# Patient Record
Sex: Female | Born: 1990 | Race: White | Hispanic: No | Marital: Married | Smoking: Never smoker
Health system: Southern US, Community
[De-identification: ages and names within clinical notes are randomized; demographics above are authoritative.]

## PROBLEM LIST (undated history)

## (undated) DIAGNOSIS — F419 Anxiety disorder, unspecified: Secondary | ICD-10-CM

## (undated) DIAGNOSIS — R519 Headache, unspecified: Secondary | ICD-10-CM

## (undated) DIAGNOSIS — O24419 Gestational diabetes mellitus in pregnancy, unspecified control: Secondary | ICD-10-CM

## (undated) DIAGNOSIS — D219 Benign neoplasm of connective and other soft tissue, unspecified: Secondary | ICD-10-CM

## (undated) HISTORY — PX: TONSILLECTOMY AND ADENOIDECTOMY: SUR1326

---
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
## 2013-11-21 ENCOUNTER — Encounter: Payer: Self-pay | Admitting: Advanced Practice Midwife

## 2013-11-21 ENCOUNTER — Ambulatory Visit (INDEPENDENT_AMBULATORY_CARE_PROVIDER_SITE_OTHER): Payer: Managed Care, Other (non HMO) | Admitting: Advanced Practice Midwife

## 2013-11-21 ENCOUNTER — Encounter: Payer: Self-pay | Admitting: *Deleted

## 2013-11-21 VITALS — BP 132/79 | HR 88 | Ht 67.0 in | Wt 165.0 lb

## 2013-11-21 DIAGNOSIS — Z3480 Encounter for supervision of other normal pregnancy, unspecified trimester: Secondary | ICD-10-CM

## 2013-11-21 DIAGNOSIS — Z113 Encounter for screening for infections with a predominantly sexual mode of transmission: Secondary | ICD-10-CM

## 2013-11-21 DIAGNOSIS — O26859 Spotting complicating pregnancy, unspecified trimester: Secondary | ICD-10-CM

## 2013-11-21 DIAGNOSIS — Z3491 Encounter for supervision of normal pregnancy, unspecified, first trimester: Secondary | ICD-10-CM | POA: Insufficient documentation

## 2013-11-21 DIAGNOSIS — Z3490 Encounter for supervision of normal pregnancy, unspecified, unspecified trimester: Secondary | ICD-10-CM

## 2013-11-21 DIAGNOSIS — O26851 Spotting complicating pregnancy, first trimester: Secondary | ICD-10-CM

## 2013-11-21 LAB — OB RESULTS CONSOLE GC/CHLAMYDIA
Chlamydia: NEGATIVE
Gonorrhea: NEGATIVE

## 2013-11-21 NOTE — Patient Instructions (Signed)
Pregnancy - First Trimester During sexual intercourse, millions of sperm go into the vagina. Only 1 sperm will penetrate and fertilize the female egg while it is in the Fallopian tube. One week later, the fertilized egg implants into the wall of the uterus. An embryo begins to develop into a baby. At 6 to 8 weeks, the eyes and face are formed and the heartbeat can be seen on ultrasound. At the end of 12 weeks (first trimester), all the baby's organs are formed. Now that you are pregnant, you will want to do everything you can to have a healthy baby. Two of the most important things are to get good prenatal care and follow your caregiver's instructions. Prenatal care is all the medical care you receive before the baby's birth. It is given to prevent, find, and treat problems during the pregnancy and childbirth. PRENATAL EXAMS  During prenatal visits, your weight, blood pressure, and urine are checked. This is done to make sure you are healthy and progressing normally during the pregnancy.  A pregnant woman should gain 25 to 35 pounds during the pregnancy. However, if you are overweight or underweight, your caregiver will advise you regarding your weight.  Your caregiver will ask and answer questions for you.  Blood work, cervical cultures, other necessary tests, and a Pap test are done during your prenatal exams. These tests are done to check on your health and the probable health of your baby. Tests are strongly recommended and done for HIV with your permission. This is the virus that causes AIDS. These tests are done because medicines can be given to help prevent your baby from being born with this infection should you have been infected without knowing it. Blood work is also used to find out your blood type, previous infections, and follow your blood levels (hemoglobin).  Low hemoglobin (anemia) is common during pregnancy. Iron and vitamins are given to help prevent this. Later in the pregnancy,  blood tests for diabetes will be done along with any other tests if any problems develop.  You may need other tests to make sure you and the baby are doing well. CHANGES DURING THE FIRST TRIMESTER  Your body goes through many changes during pregnancy. They vary from person to person. Talk to your caregiver about changes you notice and are concerned about. Changes can include:  Your menstrual period stops.  The egg and sperm carry the genes that determine what you look like. Genes from you and your partner are forming a baby. The female genes determine whether the baby is a boy or a girl.  Your body increases in girth and you may feel bloated.  Feeling sick to your stomach (nauseous) and throwing up (vomiting). If the vomiting is uncontrollable, call your caregiver.  Your breasts will begin to enlarge and become tender.  Your nipples may stick out more and become darker.  The need to urinate more. Painful urination may mean you have a bladder infection.  Tiring easily.  Loss of appetite.  Cravings for certain kinds of food.  At first, you may gain or lose a couple of pounds.  You may have changes in your emotions from day to day (excited to be pregnant or concerned something may go wrong with the pregnancy and baby).  You may have more vivid and strange dreams. HOME CARE INSTRUCTIONS   It is very important to avoid all smoking, alcohol and non-prescribed drugs during your pregnancy. These affect the formation and growth of the baby.  Avoid chemicals while pregnant to ensure the delivery of a healthy infant.  Start your prenatal visits by the 12th week of pregnancy. They are usually scheduled monthly at first, then more often in the last 2 months before delivery. Keep your caregiver's appointments. Follow your caregiver's instructions regarding medicine use, blood and lab tests, exercise, and diet.  During pregnancy, you are providing food for you and your baby. Eat regular,  well-balanced meals. Choose foods such as meat, fish, milk and other low fat dairy products, vegetables, fruits, and whole-grain breads and cereals. Your caregiver will tell you of the ideal weight gain.  You can help morning sickness by keeping soda crackers at the bedside. Eat a couple before arising in the morning. You may want to use the crackers without salt on them.  Eating 4 to 5 small meals rather than 3 large meals a day also may help the nausea and vomiting.  Drinking liquids between meals instead of during meals also seems to help nausea and vomiting.  A physical sexual relationship may be continued throughout pregnancy if there are no other problems. Problems may be early (premature) leaking of amniotic fluid from the membranes, vaginal bleeding, or belly (abdominal) pain.  Exercise regularly if there are no restrictions. Check with your caregiver or physical therapist if you are unsure of the safety of some of your exercises. Greater weight gain will occur in the last 2 trimesters of pregnancy. Exercising will help:  Control your weight.  Keep you in shape.  Prepare you for labor and delivery.  Help you lose your pregnancy weight after you deliver your baby.  Wear a good support or jogging bra for breast tenderness during pregnancy. This may help if worn during sleep too.  Ask when prenatal classes are available. Begin classes when they are offered.  Do not use hot tubs, steam rooms, or saunas.  Wear your seat belt when driving. This protects you and your baby if you are in an accident.  Avoid raw meat, uncooked cheese, cat litter boxes, and soil used by cats throughout the pregnancy. These carry germs that can cause birth defects in the baby.  The first trimester is a good time to visit your dentist for your dental health. Getting your teeth cleaned is okay. Use a softer toothbrush and brush gently during pregnancy.  Ask for help if you have financial, counseling, or  nutritional needs during pregnancy. Your caregiver will be able to offer counseling for these needs as well as refer you for other special needs.  Do not take any medicines or herbs unless told by your caregiver.  Inform your caregiver if there is any mental or physical domestic violence.  Make a list of emergency phone numbers of family, friends, hospital, and police and fire departments.  Write down your questions. Take them to your prenatal visit.  Do not douche.  Do not cross your legs.  If you have to stand for long periods of time, rotate you feet or take small steps in a circle.  You may have more vaginal secretions that may require a sanitary pad. Do not use tampons or scented sanitary pads. MEDICINES AND DRUG USE IN PREGNANCY  Take prenatal vitamins as directed. The vitamin should contain 1 milligram of folic acid. Keep all vitamins out of reach of children. Only a couple vitamins or tablets containing iron may be fatal to a baby or young child when ingested.  Avoid use of all medicines, including herbs, over-the-counter medicines, not  prescribed or suggested by your caregiver. Only take over-the-counter or prescription medicines for pain, discomfort, or fever as directed by your caregiver. Do not use aspirin, ibuprofen, or naproxen unless directed by your caregiver.  Let your caregiver also know about herbs you may be using.  Alcohol is related to a number of birth defects. This includes fetal alcohol syndrome. All alcohol, in any form, should be avoided completely. Smoking will cause low birth rate and premature babies.  Street or illegal drugs are very harmful to the baby. They are absolutely forbidden. A baby born to an addicted mother will be addicted at birth. The baby will go through the same withdrawal an adult does.  Let your caregiver know about any medicines that you have to take and for what reason you take them. SEEK MEDICAL CARE IF:  You have any concerns or  worries during your pregnancy. It is better to call with your questions if you feel they cannot wait, rather than worry about them. SEEK IMMEDIATE MEDICAL CARE IF:   An unexplained oral temperature above 102 F (38.9 C) develops, or as your caregiver suggests.  You have leaking of fluid from the vagina (birth canal). If leaking membranes are suspected, take your temperature and inform your caregiver of this when you call.  There is vaginal spotting or bleeding. Notify your caregiver of the amount and how many pads are used.  You develop a bad smelling vaginal discharge with a change in the color.  You continue to feel sick to your stomach (nauseated) and have no relief from remedies suggested. You vomit blood or coffee ground-like materials.  You lose more than 2 pounds of weight in 1 week.  You gain more than 2 pounds of weight in 1 week and you notice swelling of your face, hands, feet, or legs.  You gain 5 pounds or more in 1 week (even if you do not have swelling of your hands, face, legs, or feet).  You get exposed to [place] measles and have never had them.  You are exposed to fifth disease or chickenpox.  You develop belly (abdominal) pain. Round ligament discomfort is a common non-cancerous (benign) cause of abdominal pain in pregnancy. Your caregiver still must evaluate this.  You develop headache, fever, diarrhea, pain with urination, or shortness of breath.  You fall or are in a car accident or have any kind of trauma.  There is mental or physical violence in your home. Document Released: 05/23/2001 Document Revised: 02/21/2012 Document Reviewed: 11/24/2008 [facility] Patient Information 2014 [place].  Breastfeeding Deciding to breastfeed is one of the best choices you can make for you and your baby. A change in hormones during pregnancy causes your breast tissue to grow and increases the number and size of your milk ducts. These hormones also allow proteins,  sugars, and fats from your blood supply to make breast milk in your milk-producing glands. Hormones prevent breast milk from being released before your baby is born as well as prompt milk flow after birth. Once breastfeeding has begun, thoughts of your baby, as well as his or her sucking or crying, can stimulate the release of milk from your milk-producing glands.  BENEFITS OF BREASTFEEDING For Your Baby  Your first milk (colostrum) helps your baby's digestive system function better.   There are antibodies in your milk that help your baby fight off infections.   Your baby has a lower incidence of asthma, allergies, and sudden infant death syndrome.   The nutrients  in breast milk are better for your baby than infant formulas and are designed uniquely for your baby's needs.   Breast milk improves your baby's brain development.   Your baby is less likely to develop other conditions, such as childhood obesity, asthma, or type 2 diabetes mellitus.  For You   Breastfeeding helps to create a very special bond between you and your baby.   Breastfeeding is convenient. Breast milk is always available at the correct temperature and costs nothing.   Breastfeeding helps to burn calories and helps you lose the weight gained during pregnancy.   Breastfeeding makes your uterus contract to its prepregnancy size faster and slows bleeding (lochia) after you give birth.   Breastfeeding helps to lower your risk of developing type 2 diabetes mellitus, osteoporosis, and breast or ovarian cancer later in life. SIGNS THAT YOUR BABY IS HUNGRY Early Signs of Hunger  Increased alertness or activity.  Stretching.  Movement of the head from side to side.  Movement of the head and opening of the mouth when the corner of the mouth or cheek is stroked (rooting).  Increased sucking sounds, smacking lips, cooing, sighing, or squeaking.  Hand-to-mouth movements.  Increased sucking of fingers or  hands. Late Signs of Hunger  Fussing.  Intermittent crying. Extreme Signs of Hunger Signs of extreme hunger will require calming and consoling before your baby will be able to breastfeed successfully. Do not wait for the following signs of extreme hunger to occur before you initiate breastfeeding:   Restlessness.  A loud, strong cry.   Screaming. BREASTFEEDING BASICS Breastfeeding Initiation  Find a comfortable place to sit or lie down, with your neck and back well supported.  Place a pillow or rolled up blanket under your baby to bring him or her to the level of your breast (if you are seated). Nursing pillows are specially designed to help support your arms and your baby while you breastfeed.  Make sure that your baby's abdomen is facing your abdomen.   Gently massage your breast. With your fingertips, massage from your chest wall toward your nipple in a circular motion. This encourages milk flow. You may need to continue this action during the feeding if your milk flows slowly.  Support your breast with 4 fingers underneath and your thumb above your nipple. Make sure your fingers are well away from your nipple and your baby's mouth.   Stroke your baby's lips gently with your finger or nipple.   When your baby's mouth is open wide enough, quickly bring your baby to your breast, placing your entire nipple and as much of the colored area around your nipple (areola) as possible into your baby's mouth.   More areola should be visible above your baby's upper lip than below the lower lip.   Your baby's tongue should be between his or her lower gum and your breast.   Ensure that your baby's mouth is correctly positioned around your nipple (latched). Your baby's lips should create a seal on your breast and be turned out (everted).  It is common for your baby to suck about 2 3 minutes in order to start the flow of breast milk. Latching Teaching your baby how to latch on to your  breast properly is very important. An improper latch can cause nipple pain and decreased milk supply for you and poor weight gain in your baby. Also, if your baby is not latched onto your nipple properly, he or she may swallow some air during  feeding. This can make your baby fussy. Burping your baby when you switch breasts during the feeding can help to get rid of the air. However, teaching your baby to latch on properly is still the best way to prevent fussiness from swallowing air while breastfeeding. Signs that your baby has successfully latched on to your nipple:    Silent tugging or silent sucking, without causing you pain.   Swallowing heard between every 3 4 sucks.    Muscle movement above and in front of his or her ears while sucking.  Signs that your baby has not successfully latched on to nipple:   Sucking sounds or smacking sounds from your baby while breastfeeding.  Nipple pain. If you think your baby has not latched on correctly, slip your finger into the corner of your baby's mouth to break the suction and place it between your baby's gums. Attempt breastfeeding initiation again. Signs of Successful Breastfeeding Signs from your baby:   A gradual decrease in the number of sucks or complete cessation of sucking.   Falling asleep.   Relaxation of his or her body.   Retention of a small amount of milk in his or her mouth.   Letting go of your breast by himself or herself. Signs from you:  Breasts that have increased in firmness, weight, and size 1 3 hours after feeding.   Breasts that are softer immediately after breastfeeding.  Increased milk volume, as well as a change in milk consistency and color by the 5th day of breastfeeding.   Nipples that are not sore, cracked, or bleeding. Signs That Your [employer] is Getting Enough Milk  Wetting at least 3 diapers in a 24-hour period. The urine should be clear and pale yellow by age 140 days.  At least 3 stools in a  24-hour period by age 140 days. The stool should be soft and yellow.  At least 3 stools in a 24-hour period by age 147 days. The stool should be seedy and yellow.  No loss of weight greater than 10% of birth weight during the first 63 days of age.  Average weight gain of 4 7 ounces (120 210 mL) per week after age 14 days.  Consistent daily weight gain by age 84 days, without weight loss after the age of 2 weeks. After a feeding, your baby may spit up a small amount. This is common. BREASTFEEDING FREQUENCY AND DURATION Frequent feeding will help you make more milk and can prevent sore nipples and breast engorgement. Breastfeed when you feel the need to reduce the fullness of your breasts or when your baby shows signs of hunger. This is called "breastfeeding on demand." Avoid introducing a pacifier to your baby while you are working to establish breastfeeding (the first 4 6 weeks after your baby is born). After this time you may choose to use a pacifier. Research has shown that pacifier use during the first year of a baby's life decreases the risk of sudden infant death syndrome (SIDS). Allow your baby to feed on each breast as long as he or she wants. Breastfeed until your baby is finished feeding. When your baby unlatches or falls asleep while feeding from the first breast, offer the second breast. Because newborns are often sleepy in the first few weeks of life, you may need to awaken your baby to get him or her to feed. Breastfeeding times will vary from baby to baby. However, the following rules can serve as a guide to help you  ensure that your baby is properly fed:  Newborns (babies 49 weeks of age or younger) may breastfeed every 1 3 hours.  Newborns should not go longer than 3 hours during the day or 5 hours during the night without breastfeeding.  You should breastfeed your baby a minimum of 8 times in a 24-hour period until you begin to introduce solid foods to your baby at around 6 months of  age. BREAST MILK PUMPING Pumping and storing breast milk allows you to ensure that your baby is exclusively fed your breast milk, even at times when you are unable to breastfeed. This is especially important if you are going back to work while you are still breastfeeding or when you are not able to be present during feedings. Your lactation consultant can give you guidelines on how long it is safe to store breast milk.  A breast pump is a machine that allows you to pump milk from your breast into a sterile bottle. The pumped breast milk can then be stored in a refrigerator or freezer. Some breast pumps are operated by hand, while others use electricity. Ask your lactation consultant which type will work best for you. Breast pumps can be purchased, but some hospitals and breastfeeding support groups lease breast pumps on a monthly basis. A lactation consultant can teach you how to hand express breast milk, if you prefer not to use a pump.  CARING FOR YOUR BREASTS WHILE YOU BREASTFEED Nipples can become dry, cracked, and sore while breastfeeding. The following recommendations can help keep your breasts moisturized and healthy:  Avoid using soap on your nipples.   Wear a supportive bra. Although not required, special nursing bras and tank tops are designed to allow access to your breasts for breastfeeding without taking off your entire bra or top. Avoid wearing underwire style bras or extremely tight bras.  Air dry your nipples for 3 58minutes after each feeding.   Use only cotton bra pads to absorb leaked breast milk. Leaking of breast milk between feedings is normal.   Use lanolin on your nipples after breastfeeding. Lanolin helps to maintain your skin's normal moisture barrier. If you use pure lanolin you do not need to wash it off before feeding your baby again. Pure lanolin is not toxic to your baby. You may also hand express a few drops of breast milk and gently massage that milk into your  nipples and allow the milk to air dry. In the first few weeks after giving birth, some women experience extremely full breasts (engorgement). Engorgement can make your breasts feel heavy, warm, and tender to the touch. Engorgement peaks within 3 5 days after you give birth. The following recommendations can help ease engorgement:  Completely empty your breasts while breastfeeding or pumping. You may want to start by applying warm, moist heat (in the shower or with warm water-soaked hand towels) just before feeding or pumping. This increases circulation and helps the milk flow. If your baby does not completely empty your breasts while breastfeeding, pump any extra milk after he or she is finished.  Wear a snug bra (nursing or regular) or tank top for 1 2 days to signal your body to slightly decrease milk production.  Apply ice packs to your breasts, unless this is too uncomfortable for you.  Make sure that your baby is latched on and positioned properly while breastfeeding. If engorgement persists after 48 hours of following these recommendations, contact your health care provider or a Science writer. OVERALL  HEALTH CARE RECOMMENDATIONS WHILE BREASTFEEDING  Eat healthy foods. Alternate between meals and snacks, eating 3 of each per day. Because what you eat affects your breast milk, some of the foods may make your baby more irritable than usual. Avoid eating these foods if you are sure that they are negatively affecting your baby.  Drink milk, fruit juice, and water to satisfy your thirst (about 10 glasses a day).   Rest often, relax, and continue to take your prenatal vitamins to prevent fatigue, stress, and anemia.  Continue breast self-awareness checks.  Avoid chewing and smoking tobacco.  Avoid alcohol and drug use. Some medicines that may be harmful to your baby can pass through breast milk. It is important to ask your health care provider before taking any medicine, including all  over-the-counter and prescription medicine as well as vitamin and herbal supplements. It is possible to become pregnant while breastfeeding. If birth control is desired, ask your health care provider about options that will be safe for your baby. SEEK MEDICAL CARE IF:   You feel like you want to stop breastfeeding or have become frustrated with breastfeeding.  You have painful breasts or nipples.  Your nipples are cracked or bleeding.  Your breasts are red, tender, or warm.  You have a swollen area on either breast.  You have a fever or chills.  You have nausea or vomiting.  You have drainage other than breast milk from your nipples.  Your breasts do not become full before feedings by the 5th day after you give birth.  You feel sad and depressed.  Your baby is too sleepy to eat well.  Your baby is having trouble sleeping.   Your baby is wetting less than 3 diapers in a 24-hour period.  Your baby has less than 3 stools in a 24-hour period.  Your baby's skin or the white part of his or her eyes becomes yellow.   Your baby is not gaining weight by 38 days of age. SEEK IMMEDIATE MEDICAL CARE IF:   Your baby is overly tired (lethargic) and does not want to wake up and feed.  Your baby develops an unexplained fever. Document Released: 05/29/2005 Document Revised: 01/29/2013 Document Reviewed: 11/20/2012 [facility] Patient Information 2014 [place].

## 2013-11-21 NOTE — Progress Notes (Signed)
Pt and spouse here for NOB intake.  Bedside U/S  Showed IUP with CRL 11.3 and GA of 7w 3d

## 2013-11-21 NOTE — Progress Notes (Signed)
   Subjective:    Jalessa Peyser is a G1P0 [redacted]w[redacted]d being seen today for her first obstetrical visit.  Her obstetrical history is significant for nothing.. Patient does intend to breast feed. Pregnancy history fully reviewed.  Patient reports spotting after IC 1 week ago. None since.Danley Danker Vitals:   11/21/13 4536 11/21/13 0955  BP: 132/79   Pulse: 88   Height:  5\' 7"  (1.702 m)  Weight: 165 lb (74.844 kg)     HISTORY: OB History  Gravida Para Term Preterm AB SAB TAB Ectopic Multiple Living  1             # Outcome Date GA Lbr Len/2nd Weight Sex Delivery Anes PTL Lv  1 CUR              No past medical history on file. Past Surgical History  Procedure Laterality Date  . Kidney surgery  1994  . Tonsillectomy and adenoidectomy     Family History  Problem Relation Age of Onset  . Cancer Maternal Grandfather     bladder  . Cancer Maternal Grandmother     uterine  . Diabetes Maternal Grandfather      Exam    Uterus:     Pelvic Exam:    Perineum: No Hemorrhoids, Normal Perineum   Vulva: normal, Bartholin's, Urethra, Skene's normal   Vagina:  normal mucosa, normal discharge, wet prep done   pH: NA   Cervix: no cervical motion tenderness, nulliparous appearance and Ectropion present. Scant bleeding w/ GC/CT   Adnexa: normal adnexa and no mass, fullness, tenderness   Bony Pelvis: average  System: Breast:  normal appearance, no masses or tenderness, Taught monthly breast self examination, nipples flat   Skin: normal coloration and turgor, no rashes    Neurologic: oriented, normal, grossly non-focal   Extremities: normal strength, tone, and muscle mass   HEENT sclera clear, anicteric, oropharynx clear, no lesions and thyroid without masses   Mouth/Teeth mucous membranes moist, pharynx normal without lesions and dental hygiene good   Neck supple and no masses   Cardiovascular: regular rate and rhythm, no murmurs or gallops   Respiratory:  appears well, vitals normal, no  respiratory distress, acyanotic, normal RR, chest clear, no wheezing, crepitations, rhonchi, normal symmetric air entry   Abdomen: soft, non-tender; bowel sounds normal; no masses,  no organomegaly   Urinary: urethral meatus normal  7.3 weeks IUP w/ pos cardiac activity per informal BS [place].     Assessment:    Pregnancy: G1P0. 7.3 weeks by [place].  There are no active problems to display for this patient.    Spotting in first trimester - Plan: Cervicovaginal ancillary only, Prenatal (OB Panel), HIV antibody, Culture, OB Urine, GC/chlamydia probe amp, genital  Supervision of normal pregnancy in first trimester - Plan: Cervicovaginal ancillary only, Prenatal (OB Panel), HIV antibody, Culture, OB Urine, GC/chlamydia probe amp, genital  Supervision of normal pregnancy     Plan:     Initial labs drawn. Prenatal vitamins. Problem list reviewed and updated. Genetic Screening discussed First Screen: undecided.  Ultrasound discussed; fetal survey: requested.  Follow up in 4 weeks. 75% of 35 min visit spent on counseling and coordination of care.  Bleeding precautions.   Manya Silvas 11/21/2013

## 2013-11-22 LAB — OBSTETRIC PANEL
Antibody Screen: NEGATIVE
Basophils Absolute: 0 10*3/uL (ref 0.0–0.1)
Basophils Relative: 0 % (ref 0–1)
EOS ABS: 0 10*3/uL (ref 0.0–0.7)
Eosinophils Relative: 0 % (ref 0–5)
HCT: 40.3 % (ref 36.0–46.0)
HEMOGLOBIN: 13.7 g/dL (ref 12.0–15.0)
HEP B S AG: NEGATIVE
LYMPHS ABS: 1.5 10*3/uL (ref 0.7–4.0)
LYMPHS PCT: 14 % (ref 12–46)
MCH: 30.9 pg (ref 26.0–34.0)
MCHC: 34 g/dL (ref 30.0–36.0)
MCV: 91 fL (ref 78.0–100.0)
MONOS PCT: 6 % (ref 3–12)
Monocytes Absolute: 0.6 10*3/uL (ref 0.1–1.0)
Neutro Abs: 8.4 10*3/uL — ABNORMAL HIGH (ref 1.7–7.7)
Neutrophils Relative %: 80 % — ABNORMAL HIGH (ref 43–77)
PLATELETS: 255 10*3/uL (ref 150–400)
RBC: 4.43 MIL/uL (ref 3.87–5.11)
RDW: 13.7 % (ref 11.5–15.5)
RH TYPE: POSITIVE
RUBELLA: 3.23 {index} — AB (ref <0.90)
WBC: 10.5 10*3/uL (ref 4.0–10.5)

## 2013-11-22 LAB — GC/CHLAMYDIA PROBE AMP
CT PROBE, AMP APTIMA: NEGATIVE
GC Probe RNA: NEGATIVE

## 2013-11-22 LAB — HIV ANTIBODY (ROUTINE TESTING W REFLEX): HIV 1&2 Ab, 4th Generation: NONREACTIVE

## 2013-11-24 LAB — CULTURE, OB URINE: Colony Count: 30000

## 2013-12-16 ENCOUNTER — Ambulatory Visit (INDEPENDENT_AMBULATORY_CARE_PROVIDER_SITE_OTHER): Payer: Managed Care, Other (non HMO) | Admitting: Nurse Practitioner

## 2013-12-16 VITALS — BP 140/80 | HR 84 | Temp 97.3°F | Wt 160.0 lb

## 2013-12-16 DIAGNOSIS — G43109 Migraine with aura, not intractable, without status migrainosus: Secondary | ICD-10-CM | POA: Insufficient documentation

## 2013-12-16 MED ORDER — METOCLOPRAMIDE HCL 10 MG PO TABS: 10.0000 mg | ORAL_TABLET | Freq: Four times a day (QID) | ORAL | Status: DC

## 2013-12-16 MED ORDER — SUMATRIPTAN SUCCINATE 100 MG PO TABS: 100.0000 mg | ORAL_TABLET | Freq: Once | ORAL | Status: DC | PRN

## 2013-12-16 NOTE — Progress Notes (Signed)
Having migraine and can't keep food down.  H/O migraines in High school. Ketones-MOD   SG 1.015

## 2013-12-16 NOTE — Patient Instructions (Signed)

## 2013-12-16 NOTE — Progress Notes (Signed)
Pt is seen today as an acute visit for migraine with aura. She has had them since [employer]. She had a severe migraine yesterday and missed work. She has N/V as well with no nausea medications. She has tylenol only. Other than heartburn she has no issues with pregnancy or otherwise. Agreed to Imitrex and Reglan for migraines. Discussed medications risks and benefits and she agreed to accept the risks. We discussed the pathophysiology of migraine. She is return as needed. Repeat BP 121/82

## 2014-01-16 ENCOUNTER — Ambulatory Visit (INDEPENDENT_AMBULATORY_CARE_PROVIDER_SITE_OTHER): Payer: Managed Care, Other (non HMO) | Admitting: Family

## 2014-01-16 VITALS — BP 130/80 | HR 83 | Wt 158.0 lb

## 2014-01-16 DIAGNOSIS — Z3492 Encounter for supervision of normal pregnancy, unspecified, second trimester: Secondary | ICD-10-CM

## 2014-01-16 DIAGNOSIS — Z3491 Encounter for supervision of normal pregnancy, unspecified, first trimester: Secondary | ICD-10-CM

## 2014-01-16 DIAGNOSIS — Z3480 Encounter for supervision of other normal pregnancy, unspecified trimester: Secondary | ICD-10-CM

## 2014-01-16 NOTE — Progress Notes (Signed)
Pt concerned due to lack of weight gain in pregnancy.  Increased nausea in early pregnancy, recently resolved.  Eats approximately 4-5 small meals a day.  Suggested eating nutritionally dense food (sweet potato, avocado, etc).  Return in 2-3 week to reevaluate weight gain.  May recommend supplementation.  Schedule anatomy ultrasound.

## 2014-02-06 ENCOUNTER — Ambulatory Visit (HOSPITAL_COMMUNITY)
Admission: RE | Admit: 2014-02-06 | Discharge: 2014-02-06 | Disposition: A | Discharge status: Home / Self Care | Payer: Managed Care, Other (non HMO) | Source: Ambulatory Visit | Attending: Family | Admitting: Family

## 2014-02-06 DIAGNOSIS — Z3689 Encounter for other specified antenatal screening: Secondary | ICD-10-CM | POA: Insufficient documentation

## 2014-02-06 DIAGNOSIS — Z1389 Encounter for screening for other disorder: Secondary | ICD-10-CM

## 2014-02-06 DIAGNOSIS — Z3492 Encounter for supervision of normal pregnancy, unspecified, second trimester: Secondary | ICD-10-CM

## 2014-02-09 ENCOUNTER — Ambulatory Visit (INDEPENDENT_AMBULATORY_CARE_PROVIDER_SITE_OTHER): Payer: Managed Care, Other (non HMO) | Admitting: Advanced Practice Midwife

## 2014-02-09 ENCOUNTER — Encounter: Payer: Self-pay | Admitting: Family

## 2014-02-09 VITALS — BP 123/71 | HR 70 | Wt 161.0 lb

## 2014-02-09 DIAGNOSIS — Z0489 Encounter for examination and observation for other specified reasons: Secondary | ICD-10-CM

## 2014-02-09 DIAGNOSIS — IMO0002 Reserved for concepts with insufficient information to code with codable children: Secondary | ICD-10-CM

## 2014-02-09 DIAGNOSIS — Z3480 Encounter for supervision of other normal pregnancy, unspecified trimester: Secondary | ICD-10-CM

## 2014-02-09 NOTE — Progress Notes (Signed)
Appetite improved. Anatomy scan incomplete. Ordered F/U US in 6 weeks. Reports RLP. Comfort measures.

## 2014-02-09 NOTE — Patient Instructions (Signed)
Second Trimester of Pregnancy The second trimester is from week 13 through week 28, months 4 through 6. The second trimester is often a time when you feel your best. Your body has also adjusted to being pregnant, and you begin to feel better physically. Usually, morning sickness has lessened or quit completely, you may have more energy, and you may have an increase in appetite. The second trimester is also a time when the fetus is growing rapidly. At the end of the sixth month, the fetus is about 9 inches long and weighs about 1 pounds. You will likely begin to feel the baby move (quickening) between 18 and 20 weeks of the pregnancy. BODY CHANGES Your body goes through many changes during pregnancy. The changes vary from woman to woman.   Your weight will continue to increase. You will notice your lower abdomen bulging out.  You may begin to get stretch marks on your hips, abdomen, and breasts.  You may develop headaches that can be relieved by medicines approved by your health care provider.  You may urinate more often because the fetus is pressing on your bladder.  You may develop or continue to have heartburn as a result of your pregnancy.  You may develop constipation because certain hormones are causing the muscles that push waste through your intestines to slow down.  You may develop hemorrhoids or swollen, bulging veins (varicose veins).  You may have back pain because of the weight gain and pregnancy hormones relaxing your joints between the bones in your pelvis and as a result of a shift in weight and the muscles that support your balance.  Your breasts will continue to grow and be tender.  Your gums may bleed and may be sensitive to brushing and flossing.  Dark spots or blotches (chloasma, mask of pregnancy) may develop on your face. This will likely fade after the baby is born.  A dark line from your belly button to the pubic area (linea nigra) may appear. This will likely  fade after the baby is born.  You may have changes in your hair. These can include thickening of your hair, rapid growth, and changes in texture. Some women also have hair loss during or after pregnancy, or hair that feels dry or thin. Your hair will most likely return to normal after your baby is born. WHAT TO EXPECT AT YOUR PRENATAL VISITS During a routine prenatal visit:  You will be weighed to make sure you and the fetus are growing normally.  Your blood pressure will be taken.  Your abdomen will be measured to track your baby's growth.  The fetal heartbeat will be listened to.  Any test results from the previous visit will be discussed. Your health care provider may ask you:  How you are feeling.  If you are feeling the baby move.  If you have had any abnormal symptoms, such as leaking fluid, bleeding, severe headaches, or abdominal cramping.  If you have any questions. Other tests that may be performed during your second trimester include:  Blood tests that check for:  Low iron levels (anemia).  Gestational diabetes (between 24 and 28 weeks).  Rh antibodies.  Urine tests to check for infections, diabetes, or protein in the urine.  An ultrasound to confirm the proper growth and development of the baby.  An amniocentesis to check for possible genetic problems.  Fetal screens for spina bifida and Down syndrome. HOME CARE INSTRUCTIONS   Avoid all smoking, herbs, alcohol, and unprescribed   drugs. These chemicals affect the formation and growth of the baby.  Follow your health care provider's instructions regarding medicine use. There are medicines that are either safe or unsafe to take during pregnancy.  Exercise only as directed by your health care provider. Experiencing uterine cramps is a good sign to stop exercising.  Continue to eat regular, healthy meals.  Wear a good support bra for breast tenderness.  Do not use hot tubs, steam rooms, or saunas.  Wear  your seat belt at all times when driving.  Avoid raw meat, uncooked cheese, cat litter boxes, and soil used by cats. These carry germs that can cause birth defects in the baby.  Take your prenatal vitamins.  Try taking a stool softener (if your health care provider approves) if you develop constipation. Eat more high-fiber foods, such as fresh vegetables or fruit and whole grains. Drink plenty of fluids to keep your urine clear or pale yellow.  Take warm sitz baths to soothe any pain or discomfort caused by hemorrhoids. Use hemorrhoid cream if your health care provider approves.  If you develop varicose veins, wear support hose. Elevate your feet for 15 minutes, 3-4 times a day. Limit salt in your diet.  Avoid heavy lifting, wear low heel shoes, and practice good posture.  Rest with your legs elevated if you have leg cramps or low back pain.  Visit your dentist if you have not gone yet during your pregnancy. Use a soft toothbrush to brush your teeth and be gentle when you floss.  A sexual relationship may be continued unless your health care provider directs you otherwise.  Continue to go to all your prenatal visits as directed by your health care provider. SEEK MEDICAL CARE IF:   You have dizziness.  You have mild pelvic cramps, pelvic pressure, or nagging pain in the abdominal area.  You have persistent nausea, vomiting, or diarrhea.  You have a bad smelling vaginal discharge.  You have pain with urination. SEEK IMMEDIATE MEDICAL CARE IF:   You have a fever.  You are leaking fluid from your vagina.  You have spotting or bleeding from your vagina.  You have severe abdominal cramping or pain.  You have rapid weight gain or loss.  You have shortness of breath with chest pain.  You notice sudden or extreme swelling of your face, hands, ankles, feet, or legs.  You have not felt your baby move in over an hour.  You have severe headaches that do not go away with  medicine.  You have vision changes. Document Released: 05/23/2001 Document Revised: 06/03/2013 Document Reviewed: 07/30/2012 Exit[employer] Patient Information 2015 [employer]. This information is not intended to replace advice given to you by your health care provider. Make sure you discuss any questions you have with your health care provider.  Breastfeeding Deciding to breastfeed is one of the best choices you can make for you and your baby. A change in hormones during pregnancy causes your breast tissue to grow and increases the number and size of your milk ducts. These hormones also allow proteins, sugars, and fats from your blood supply to make breast milk in your milk-producing glands. Hormones prevent breast milk from being released before your baby is born as well as prompt milk flow after birth. Once breastfeeding has begun, thoughts of your baby, as well as his or her sucking or crying, can stimulate the release of milk from your milk-producing glands.  BENEFITS OF BREASTFEEDING For Your Baby  Your first   milk (colostrum) helps your baby's digestive system function better.   There are antibodies in your milk that help your baby fight off infections.   Your baby has a lower incidence of asthma, allergies, and sudden infant death syndrome.   The nutrients in breast milk are better for your baby than infant formulas and are designed uniquely for your baby's needs.   Breast milk improves your baby's brain development.   Your baby is less likely to develop other conditions, such as childhood obesity, asthma, or type 2 diabetes mellitus.  For You   Breastfeeding helps to create a very special bond between you and your baby.   Breastfeeding is convenient. Breast milk is always available at the correct temperature and costs nothing.   Breastfeeding helps to burn calories and helps you lose the weight gained during pregnancy.   Breastfeeding makes your uterus contract to its  prepregnancy size faster and slows bleeding (lochia) after you give birth.   Breastfeeding helps to lower your risk of developing type 2 diabetes mellitus, osteoporosis, and breast or ovarian cancer later in life. SIGNS THAT YOUR BABY IS HUNGRY Early Signs of Hunger  Increased alertness or activity.  Stretching.  Movement of the head from side to side.  Movement of the head and opening of the mouth when the corner of the mouth or cheek is stroked (rooting).  Increased sucking sounds, smacking lips, cooing, sighing, or squeaking.  Hand-to-mouth movements.  Increased sucking of fingers or hands. Late Signs of Hunger  Fussing.  Intermittent crying. Extreme Signs of Hunger Signs of extreme hunger will require calming and consoling before your baby will be able to breastfeed successfully. Do not wait for the following signs of extreme hunger to occur before you initiate breastfeeding:   Restlessness.  A loud, strong cry.   Screaming. BREASTFEEDING BASICS Breastfeeding Initiation  Find a comfortable place to sit or lie down, with your neck and back well supported.  Place a pillow or rolled up blanket under your baby to bring him or her to the level of your breast (if you are seated). Nursing pillows are specially designed to help support your arms and your baby while you breastfeed.  Make sure that your baby's abdomen is facing your abdomen.   Gently massage your breast. With your fingertips, massage from your chest wall toward your nipple in a circular motion. This encourages milk flow. You may need to continue this action during the feeding if your milk flows slowly.  Support your breast with 4 fingers underneath and your thumb above your nipple. Make sure your fingers are well away from your nipple and your baby's mouth.   Stroke your baby's lips gently with your finger or nipple.   When your baby's mouth is open wide enough, quickly bring your baby to your breast,  placing your entire nipple and as much of the colored area around your nipple (areola) as possible into your baby's mouth.   More areola should be visible above your baby's upper lip than below the lower lip.   Your baby's tongue should be between his or her lower gum and your breast.   Ensure that your baby's mouth is correctly positioned around your nipple (latched). Your baby's lips should create a seal on your breast and be turned out (everted).  It is common for your baby to suck about 2-3 minutes in order to start the flow of breast milk. Latching Teaching your baby how to latch on to your breast   properly is very important. An improper latch can cause nipple pain and decreased milk supply for you and poor weight gain in your baby. Also, if your baby is not latched onto your nipple properly, he or she may swallow some air during feeding. This can make your baby fussy. Burping your baby when you switch breasts during the feeding can help to get rid of the air. However, teaching your baby to latch on properly is still the best way to prevent fussiness from swallowing air while breastfeeding. Signs that your baby has successfully latched on to your nipple:    Silent tugging or silent sucking, without causing you pain.   Swallowing heard between every 3-4 sucks.    Muscle movement above and in front of his or her ears while sucking.  Signs that your baby has not successfully latched on to nipple:   Sucking sounds or smacking sounds from your baby while breastfeeding.  Nipple pain. If you think your baby has not latched on correctly, slip your finger into the corner of your baby's mouth to break the suction and place it between your baby's gums. Attempt breastfeeding initiation again. Signs of Successful Breastfeeding Signs from your baby:   A gradual decrease in the number of sucks or complete cessation of sucking.   Falling asleep.   Relaxation of his or her body.    Retention of a small amount of milk in his or her mouth.   Letting go of your breast by himself or herself. Signs from you:  Breasts that have increased in firmness, weight, and size 1-3 hours after feeding.   Breasts that are softer immediately after breastfeeding.  Increased milk volume, as well as a change in milk consistency and color by the fifth day of breastfeeding.   Nipples that are not sore, cracked, or bleeding. Signs That Your Baby is Getting Enough Milk  Wetting at least 3 diapers in a 24-hour period. The urine should be clear and pale yellow by age 5 days.  At least 3 stools in a 24-hour period by age 5 days. The stool should be soft and yellow.  At least 3 stools in a 24-hour period by age 7 days. The stool should be seedy and yellow.  No loss of weight greater than 10% of birth weight during the first 3 days of age.  Average weight gain of 4-7 ounces (113-198 g) per week after age 4 days.  Consistent daily weight gain by age 5 days, without weight loss after the age of 2 weeks. After a feeding, your baby may spit up a small amount. This is common. BREASTFEEDING FREQUENCY AND DURATION Frequent feeding will help you make more milk and can prevent sore nipples and breast engorgement. Breastfeed when you feel the need to reduce the fullness of your breasts or when your baby shows signs of hunger. This is called "breastfeeding on demand." Avoid introducing a pacifier to your baby while you are working to establish breastfeeding (the first 4-6 weeks after your baby is born). After this time you may choose to use a pacifier. Research has shown that pacifier use during the first year of a baby's life decreases the risk of sudden infant death syndrome (SIDS). Allow your baby to feed on each breast as long as he or she wants. Breastfeed until your baby is finished feeding. When your baby unlatches or falls asleep while feeding from the first breast, offer the second breast.  Because newborns are often sleepy in the   first few weeks of life, you may need to awaken your baby to get him or her to feed. Breastfeeding times will vary from baby to baby. However, the following rules can serve as a guide to help you ensure that your baby is properly fed:  Newborns (babies 4 weeks of age or younger) may breastfeed every 1-3 hours.  Newborns should not go longer than 3 hours during the day or 5 hours during the night without breastfeeding.  You should breastfeed your baby a minimum of 8 times in a 24-hour period until you begin to introduce solid foods to your baby at around 6 months of age. BREAST MILK PUMPING Pumping and storing breast milk allows you to ensure that your baby is exclusively fed your breast milk, even at times when you are unable to breastfeed. This is especially important if you are going back to work while you are still breastfeeding or when you are not able to be present during feedings. Your lactation consultant can give you guidelines on how long it is safe to store breast milk.  A breast pump is a machine that allows you to pump milk from your breast into a sterile bottle. The pumped breast milk can then be stored in a refrigerator or freezer. Some breast pumps are operated by hand, while others use electricity. Ask your lactation consultant which type will work best for you. Breast pumps can be purchased, but some hospitals and breastfeeding support groups lease breast pumps on a monthly basis. A lactation consultant can teach you how to hand express breast milk, if you prefer not to use a pump.  CARING FOR YOUR BREASTS WHILE YOU BREASTFEED Nipples can become dry, cracked, and sore while breastfeeding. The following recommendations can help keep your breasts moisturized and healthy:  Avoid using soap on your nipples.   Wear a supportive bra. Although not required, special nursing bras and tank tops are designed to allow access to your breasts for  breastfeeding without taking off your entire bra or top. Avoid wearing underwire-style bras or extremely tight bras.  Air dry your nipples for 3-4minutes after each feeding.   Use only cotton bra pads to absorb leaked breast milk. Leaking of breast milk between feedings is normal.   Use lanolin on your nipples after breastfeeding. Lanolin helps to maintain your skin's normal moisture barrier. If you use pure lanolin, you do not need to wash it off before feeding your baby again. Pure lanolin is not toxic to your baby. You may also hand express a few drops of breast milk and gently massage that milk into your nipples and allow the milk to air dry. In the first few weeks after giving birth, some women experience extremely full breasts (engorgement). Engorgement can make your breasts feel heavy, warm, and tender to the touch. Engorgement peaks within 3-5 days after you give birth. The following recommendations can help ease engorgement:  Completely empty your breasts while breastfeeding or pumping. You may want to start by applying warm, moist heat (in the shower or with warm water-soaked hand towels) just before feeding or pumping. This increases circulation and helps the milk flow. If your baby does not completely empty your breasts while breastfeeding, pump any extra milk after he or she is finished.  Wear a snug bra (nursing or regular) or tank top for 1-2 days to signal your body to slightly decrease milk production.  Apply ice packs to your breasts, unless this is too uncomfortable for you.    Make sure that your baby is latched on and positioned properly while breastfeeding. If engorgement persists after 48 hours of following these recommendations, contact your health care provider or a lactation consultant. OVERALL HEALTH CARE RECOMMENDATIONS WHILE BREASTFEEDING  Eat healthy foods. Alternate between meals and snacks, eating 3 of each per day. Because what you eat affects your breast milk,  some of the foods may make your baby more irritable than usual. Avoid eating these foods if you are sure that they are negatively affecting your baby.  Drink milk, fruit juice, and water to satisfy your thirst (about 10 glasses a day).   Rest often, relax, and continue to take your prenatal vitamins to prevent fatigue, stress, and anemia.  Continue breast self-awareness checks.  Avoid chewing and smoking tobacco.  Avoid alcohol and drug use. Some medicines that may be harmful to your baby can pass through breast milk. It is important to ask your health care provider before taking any medicine, including all over-the-counter and prescription medicine as well as vitamin and herbal supplements. It is possible to become pregnant while breastfeeding. If birth control is desired, ask your health care provider about options that will be safe for your baby. SEEK MEDICAL CARE IF:   You feel like you want to stop breastfeeding or have become frustrated with breastfeeding.  You have painful breasts or nipples.  Your nipples are cracked or bleeding.  Your breasts are red, tender, or warm.  You have a swollen area on either breast.  You have a fever or chills.  You have nausea or vomiting.  You have drainage other than breast milk from your nipples.  Your breasts do not become full before feedings by the fifth day after you give birth.  You feel sad and depressed.  Your baby is too sleepy to eat well.  Your baby is having trouble sleeping.   Your baby is wetting less than 3 diapers in a 24-hour period.  Your baby has less than 3 stools in a 24-hour period.  Your baby's skin or the white part of his or her eyes becomes yellow.   Your baby is not gaining weight by 5 days of age. SEEK IMMEDIATE MEDICAL CARE IF:   Your baby is overly tired (lethargic) and does not want to wake up and feed.  Your baby develops an unexplained fever. Document Released: 05/29/2005 Document Revised:  06/03/2013 Document Reviewed: 11/20/2012 [employer] Patient Information 2015 [employer]. This information is not intended to replace advice given to you by your health care provider. Make sure you discuss any questions you have with your health care provider.  

## 2014-03-13 ENCOUNTER — Ambulatory Visit (INDEPENDENT_AMBULATORY_CARE_PROVIDER_SITE_OTHER): Payer: Managed Care, Other (non HMO) | Admitting: Advanced Practice Midwife

## 2014-03-13 VITALS — BP 136/75 | HR 77 | Wt 169.0 lb

## 2014-03-13 DIAGNOSIS — Z3492 Encounter for supervision of normal pregnancy, unspecified, second trimester: Secondary | ICD-10-CM

## 2014-03-13 NOTE — Addendum Note (Signed)
Addended by: Fatima Blank A on: 03/13/2014 10:22 AM   Modules accepted: Level of Service

## 2014-03-13 NOTE — Progress Notes (Signed)
Doing well.  Good fetal movement, denies vaginal bleeding, LOF, regular contractions. Having some back pain and sciatica, sees a chiropractor which is helping.  Recommend pregnancy support belt.  Discussed GTT at next visit.

## 2014-03-20 ENCOUNTER — Ambulatory Visit (HOSPITAL_COMMUNITY)
Admission: RE | Admit: 2014-03-20 | Discharge: 2014-03-20 | Disposition: A | Discharge status: Home / Self Care | Payer: Managed Care, Other (non HMO) | Source: Ambulatory Visit | Attending: Advanced Practice Midwife | Admitting: Advanced Practice Midwife

## 2014-03-20 DIAGNOSIS — Z3A24 24 weeks gestation of pregnancy: Secondary | ICD-10-CM

## 2014-03-20 DIAGNOSIS — IMO0002 Reserved for concepts with insufficient information to code with codable children: Secondary | ICD-10-CM

## 2014-03-20 DIAGNOSIS — Z36 Encounter for antenatal screening of mother: Secondary | ICD-10-CM | POA: Insufficient documentation

## 2014-03-20 DIAGNOSIS — Z0489 Encounter for examination and observation for other specified reasons: Secondary | ICD-10-CM

## 2014-03-23 ENCOUNTER — Encounter: Payer: Self-pay | Admitting: Advanced Practice Midwife

## 2014-03-30 ENCOUNTER — Ambulatory Visit (INDEPENDENT_AMBULATORY_CARE_PROVIDER_SITE_OTHER): Payer: Managed Care, Other (non HMO) | Admitting: Obstetrics & Gynecology

## 2014-03-30 VITALS — BP 132/81 | HR 89 | Wt 175.0 lb

## 2014-03-30 DIAGNOSIS — Z3491 Encounter for supervision of normal pregnancy, unspecified, first trimester: Secondary | ICD-10-CM

## 2014-03-30 DIAGNOSIS — R300 Dysuria: Secondary | ICD-10-CM

## 2014-03-30 DIAGNOSIS — Z3481 Encounter for supervision of other normal pregnancy, first trimester: Secondary | ICD-10-CM

## 2014-03-30 LAB — POCT URINALYSIS DIPSTICK
Bilirubin, UA: NEGATIVE
GLUCOSE UA: NEGATIVE
Ketones, UA: NEGATIVE
NITRITE UA: NEGATIVE
PROTEIN UA: NEGATIVE
SPEC GRAV UA: 1.01
UROBILINOGEN UA: 0.2
pH, UA: 7

## 2014-03-30 MED ORDER — SULFAMETHOXAZOLE-TMP DS 800-160 MG PO TABS: 1.0000 | ORAL_TABLET | Freq: Two times a day (BID) | ORAL | Status: DC

## 2014-03-30 NOTE — Progress Notes (Signed)
UTI Sx - Dip results Trace Leuks and Blood

## 2014-03-30 NOTE — Progress Notes (Signed)
Work in visit for UTI symptoms. Good FM. She has an appt next week for glucola. I will culture her urine but go ahead and e prescribe bactrim for a week. Plenty of water recommended. We discussed the flu vaccine and I recommended it. She is still undecided.

## 2014-04-01 LAB — CULTURE, OB URINE: Colony Count: 40000

## 2014-04-10 ENCOUNTER — Encounter: Payer: Self-pay | Admitting: Advanced Practice Midwife

## 2014-04-10 ENCOUNTER — Ambulatory Visit (INDEPENDENT_AMBULATORY_CARE_PROVIDER_SITE_OTHER): Payer: Managed Care, Other (non HMO) | Admitting: Advanced Practice Midwife

## 2014-04-10 VITALS — BP 131/79 | HR 79 | Wt 175.0 lb

## 2014-04-10 DIAGNOSIS — Z3482 Encounter for supervision of other normal pregnancy, second trimester: Secondary | ICD-10-CM

## 2014-04-10 DIAGNOSIS — Z3492 Encounter for supervision of normal pregnancy, unspecified, second trimester: Secondary | ICD-10-CM

## 2014-04-10 LAB — CBC
HCT: 34.7 % — ABNORMAL LOW (ref 36.0–46.0)
HEMOGLOBIN: 11.9 g/dL — AB (ref 12.0–15.0)
MCH: 33 pg (ref 26.0–34.0)
MCHC: 34.3 g/dL (ref 30.0–36.0)
MCV: 96.1 fL (ref 78.0–100.0)
Platelets: 195 10*3/uL (ref 150–400)
RBC: 3.61 MIL/uL — ABNORMAL LOW (ref 3.87–5.11)
RDW: 12.6 % (ref 11.5–15.5)
WBC: 10.4 10*3/uL (ref 4.0–10.5)

## 2014-04-10 NOTE — Progress Notes (Signed)
Followup US was still not sufficient in seeing heart views. They recommend followup study. Will schedule. Glucola today

## 2014-04-10 NOTE — Addendum Note (Signed)
Addended by: Asencion Islam on: 04/10/2014 10:02 AM   Modules accepted: Orders

## 2014-04-10 NOTE — Patient Instructions (Signed)
Third Trimester of Pregnancy The third trimester is from week 29 through week 42, months 7 through 9. The third trimester is a time when the fetus is growing rapidly. At the end of the ninth month, the fetus is about 20 inches in length and weighs 6-10 pounds.  BODY CHANGES Your body goes through many changes during pregnancy. The changes vary from woman to woman.   Your weight will continue to increase. You can expect to gain 25-35 pounds (11-16 kg) by the end of the pregnancy.  You may begin to get stretch marks on your hips, abdomen, and breasts.  You may urinate more often because the fetus is moving lower into your pelvis and pressing on your bladder.  You may develop or continue to have heartburn as a result of your pregnancy.  You may develop constipation because certain hormones are causing the muscles that push waste through your intestines to slow down.  You may develop hemorrhoids or swollen, bulging veins (varicose veins).  You may have pelvic pain because of the weight gain and pregnancy hormones relaxing your joints between the bones in your pelvis. Backaches may result from overexertion of the muscles supporting your posture.  You may have changes in your hair. These can include thickening of your hair, rapid growth, and changes in texture. Some women also have hair loss during or after pregnancy, or hair that feels dry or thin. Your hair will most likely return to normal after your baby is born.  Your breasts will continue to grow and be tender. A yellow discharge may leak from your breasts called colostrum.  Your belly button may stick out.  You may feel short of breath because of your expanding uterus.  You may notice the fetus "dropping," or moving lower in your abdomen.  You may have a bloody mucus discharge. This usually occurs a few days to a week before labor begins.  Your cervix becomes thin and soft (effaced) near your due date. WHAT TO EXPECT AT YOUR PRENATAL  EXAMS  You will have prenatal exams every 2 weeks until week 36. Then, you will have weekly prenatal exams. During a routine prenatal visit:  You will be weighed to make sure you and the fetus are growing normally.  Your blood pressure is taken.  Your abdomen will be measured to track your baby's growth.  The fetal heartbeat will be listened to.  Any test results from the previous visit will be discussed.  You may have a cervical check near your due date to see if you have effaced. At around 36 weeks, your caregiver will check your cervix. At the same time, your caregiver will also perform a test on the secretions of the vaginal tissue. This test is to determine if a type of bacteria, Group B streptococcus, is present. Your caregiver will explain this further. Your caregiver may ask you:  What your birth plan is.  How you are feeling.  If you are feeling the baby move.  If you have had any abnormal symptoms, such as leaking fluid, bleeding, severe headaches, or abdominal cramping.  If you have any questions. Other tests or screenings that may be performed during your third trimester include:  Blood tests that check for low iron levels (anemia).  Fetal testing to check the health, activity level, and growth of the fetus. Testing is done if you have certain medical conditions or if there are problems during the pregnancy. FALSE LABOR You may feel small, irregular contractions that   eventually go away. These are called Braxton Hicks contractions, or false labor. Contractions may last for hours, days, or even weeks before true labor sets in. If contractions come at regular intervals, intensify, or become painful, it is best to be seen by your caregiver.  SIGNS OF LABOR   Menstrual-like cramps.  Contractions that are 5 minutes apart or less.  Contractions that start on the top of the uterus and spread down to the lower abdomen and back.  A sense of increased pelvic pressure or back  pain.  A watery or bloody mucus discharge that comes from the vagina. If you have any of these signs before the 37th week of pregnancy, call your caregiver right away. You need to go to the hospital to get checked immediately. HOME CARE INSTRUCTIONS   Avoid all smoking, herbs, alcohol, and unprescribed drugs. These chemicals affect the formation and growth of the baby.  Follow your caregiver's instructions regarding medicine use. There are medicines that are either safe or unsafe to take during pregnancy.  Exercise only as directed by your caregiver. Experiencing uterine cramps is a good sign to stop exercising.  Continue to eat regular, healthy meals.  Wear a good support bra for breast tenderness.  Do not use hot tubs, steam rooms, or saunas.  Wear your seat belt at all times when driving.  Avoid raw meat, uncooked cheese, cat litter boxes, and soil used by cats. These carry germs that can cause birth defects in the baby.  Take your prenatal vitamins.  Try taking a stool softener (if your caregiver approves) if you develop constipation. Eat more high-fiber foods, such as fresh vegetables or fruit and whole grains. Drink plenty of fluids to keep your urine clear or pale yellow.  Take warm sitz baths to soothe any pain or discomfort caused by hemorrhoids. Use hemorrhoid cream if your caregiver approves.  If you develop varicose veins, wear support hose. Elevate your feet for 15 minutes, 3-4 times a day. Limit salt in your diet.  Avoid heavy lifting, wear low heal shoes, and practice good posture.  Rest a lot with your legs elevated if you have leg cramps or low back pain.  Visit your dentist if you have not gone during your pregnancy. Use a soft toothbrush to brush your teeth and be gentle when you floss.  A sexual relationship may be continued unless your caregiver directs you otherwise.  Do not travel far distances unless it is absolutely necessary and only with the approval  of your caregiver.  Take prenatal classes to understand, practice, and ask questions about the labor and delivery.  Make a trial run to the hospital.  Pack your hospital bag.  Prepare the baby's nursery.  Continue to go to all your prenatal visits as directed by your caregiver. SEEK MEDICAL CARE IF:  You are unsure if you are in labor or if your water has broken.  You have dizziness.  You have mild pelvic cramps, pelvic pressure, or nagging pain in your abdominal area.  You have persistent nausea, vomiting, or diarrhea.  You have a bad smelling vaginal discharge.  You have pain with urination. SEEK IMMEDIATE MEDICAL CARE IF:   You have a fever.  You are leaking fluid from your vagina.  You have spotting or bleeding from your vagina.  You have severe abdominal cramping or pain.  You have rapid weight loss or gain.  You have shortness of breath with chest pain.  You notice sudden or extreme swelling   of your face, hands, ankles, feet, or legs.  You have not felt your baby move in over an hour.  You have severe headaches that do not go away with medicine.  You have vision changes. Document Released: 05/23/2001 Document Revised: 06/03/2013 Document Reviewed: 07/30/2012 Exit[employer] Patient Information 2015 [employer]. This information is not intended to replace advice given to you by your health care provider. Make sure you discuss any questions you have with your health care provider.  

## 2014-04-11 ENCOUNTER — Encounter (HOSPITAL_COMMUNITY): Payer: Self-pay | Admitting: Advanced Practice Midwife

## 2014-04-11 DIAGNOSIS — R7309 Other abnormal glucose: Secondary | ICD-10-CM | POA: Insufficient documentation

## 2014-04-11 LAB — HIV ANTIBODY (ROUTINE TESTING W REFLEX): HIV 1&2 Ab, 4th Generation: NONREACTIVE

## 2014-04-11 LAB — RPR

## 2014-04-11 LAB — GLUCOSE TOLERANCE, 1 HOUR (50G) W/O FASTING: Glucose, 1 Hour GTT: 155 mg/dL — ABNORMAL HIGH (ref 70–140)

## 2014-04-13 ENCOUNTER — Telehealth: Payer: Self-pay | Admitting: *Deleted

## 2014-04-13 ENCOUNTER — Encounter: Payer: Self-pay | Admitting: Advanced Practice Midwife

## 2014-04-13 DIAGNOSIS — R7309 Other abnormal glucose: Secondary | ICD-10-CM

## 2014-04-13 NOTE — Telephone Encounter (Signed)
-----   Message from Seabron Spates, CNM sent at 04/11/2014  9:16 PM EDT ----- Regarding: elevated glucola Glucola 155  Needs 3 hr test Thanks marie

## 2014-04-13 NOTE — Telephone Encounter (Signed)
Pt notified of abnormal 1 hr and scheduling a 3 hr GTT for Thursday.

## 2014-04-15 ENCOUNTER — Telehealth: Payer: Self-pay | Admitting: *Deleted

## 2014-04-15 LAB — GLUCOSE TOLERANCE, 3 HOURS
GLUCOSE 3 HOUR GTT: 132 mg/dL (ref 70–144)
GLUCOSE, 2 HOUR-GESTATIONAL: 161 mg/dL (ref 70–164)
Glucose Tolerance, 1 hour: 162 mg/dL (ref 70–189)
Glucose Tolerance, Fasting: 66 mg/dL — ABNORMAL LOW (ref 70–104)

## 2014-04-15 NOTE — Telephone Encounter (Signed)
Pt notified of 3 hr GTT results.  No further testing necessary.

## 2014-04-17 ENCOUNTER — Ambulatory Visit (HOSPITAL_COMMUNITY)
Admission: RE | Admit: 2014-04-17 | Discharge: 2014-04-17 | Disposition: A | Discharge status: Home / Self Care | Payer: Managed Care, Other (non HMO) | Source: Ambulatory Visit | Attending: Advanced Practice Midwife | Admitting: Advanced Practice Midwife

## 2014-04-17 DIAGNOSIS — IMO0002 Reserved for concepts with insufficient information to code with codable children: Secondary | ICD-10-CM | POA: Insufficient documentation

## 2014-04-17 DIAGNOSIS — Z36 Encounter for antenatal screening of mother: Secondary | ICD-10-CM | POA: Diagnosis present

## 2014-04-17 DIAGNOSIS — Z3A28 28 weeks gestation of pregnancy: Secondary | ICD-10-CM | POA: Insufficient documentation

## 2014-04-17 DIAGNOSIS — Z3492 Encounter for supervision of normal pregnancy, unspecified, second trimester: Secondary | ICD-10-CM

## 2014-04-17 DIAGNOSIS — Z0489 Encounter for examination and observation for other specified reasons: Secondary | ICD-10-CM | POA: Insufficient documentation

## 2014-04-23 ENCOUNTER — Ambulatory Visit (INDEPENDENT_AMBULATORY_CARE_PROVIDER_SITE_OTHER): Payer: Managed Care, Other (non HMO) | Admitting: Obstetrics & Gynecology

## 2014-04-23 ENCOUNTER — Encounter: Payer: Self-pay | Admitting: Obstetrics & Gynecology

## 2014-04-23 VITALS — BP 115/74 | HR 89 | Wt 179.0 lb

## 2014-04-23 DIAGNOSIS — Z3481 Encounter for supervision of other normal pregnancy, first trimester: Secondary | ICD-10-CM

## 2014-04-23 DIAGNOSIS — Z3491 Encounter for supervision of normal pregnancy, unspecified, first trimester: Secondary | ICD-10-CM

## 2014-04-23 NOTE — Progress Notes (Signed)
Routine visit. Good FM. No problems. She is still uncertain about a flu vaccine.We discussed in depth [employer]'s position on this. She will let us know if she decides to get one.

## 2014-04-28 ENCOUNTER — Ambulatory Visit (INDEPENDENT_AMBULATORY_CARE_PROVIDER_SITE_OTHER): Payer: Managed Care, Other (non HMO) | Admitting: *Deleted

## 2014-04-28 DIAGNOSIS — Z23 Encounter for immunization: Secondary | ICD-10-CM

## 2014-05-06 ENCOUNTER — Ambulatory Visit (INDEPENDENT_AMBULATORY_CARE_PROVIDER_SITE_OTHER): Payer: Managed Care, Other (non HMO) | Admitting: Obstetrics & Gynecology

## 2014-05-06 VITALS — BP 114/74 | HR 85 | Wt 179.0 lb

## 2014-05-06 DIAGNOSIS — O24410 Gestational diabetes mellitus in pregnancy, diet controlled: Secondary | ICD-10-CM

## 2014-05-06 DIAGNOSIS — Z3483 Encounter for supervision of other normal pregnancy, third trimester: Secondary | ICD-10-CM

## 2014-05-06 DIAGNOSIS — Z23 Encounter for immunization: Secondary | ICD-10-CM

## 2014-05-06 DIAGNOSIS — Z3491 Encounter for supervision of normal pregnancy, unspecified, first trimester: Secondary | ICD-10-CM

## 2014-05-06 LAB — AMNIOTIC FLUID INDEX: OTHER: 16

## 2014-05-06 NOTE — Progress Notes (Signed)
Routine visit. Good FM. No problems. TDAP today. Prescription given for breast pump. She has the certificate for the completed waterbirth class. She is looking into rentals. PTL precautions reviewed. AFI today 16.

## 2014-05-06 NOTE — Addendum Note (Signed)
Addended by: Gretchen Short on: 05/06/2014 11:11 AM   Modules accepted: Orders

## 2014-05-21 ENCOUNTER — Ambulatory Visit (INDEPENDENT_AMBULATORY_CARE_PROVIDER_SITE_OTHER): Payer: Managed Care, Other (non HMO) | Admitting: Obstetrics & Gynecology

## 2014-05-21 ENCOUNTER — Encounter: Payer: Self-pay | Admitting: Obstetrics & Gynecology

## 2014-05-21 VITALS — BP 122/74 | HR 88 | Wt 184.0 lb

## 2014-05-21 DIAGNOSIS — Z3481 Encounter for supervision of other normal pregnancy, first trimester: Secondary | ICD-10-CM

## 2014-05-21 DIAGNOSIS — Z3491 Encounter for supervision of normal pregnancy, unspecified, first trimester: Secondary | ICD-10-CM

## 2014-05-21 NOTE — Progress Notes (Signed)
Routine visit. Good FM. No problems.  

## 2014-06-03 ENCOUNTER — Ambulatory Visit (INDEPENDENT_AMBULATORY_CARE_PROVIDER_SITE_OTHER): Payer: Managed Care, Other (non HMO) | Admitting: Obstetrics & Gynecology

## 2014-06-03 VITALS — BP 135/72 | HR 92 | Wt 185.0 lb

## 2014-06-03 DIAGNOSIS — Z3491 Encounter for supervision of normal pregnancy, unspecified, first trimester: Secondary | ICD-10-CM

## 2014-06-03 DIAGNOSIS — Z3481 Encounter for supervision of other normal pregnancy, first trimester: Secondary | ICD-10-CM

## 2014-06-03 NOTE — Progress Notes (Signed)
Routine visit. Good FM. No problems. Cultures at next visit

## 2014-06-12 NOTE — L&D Delivery Note (Signed)
Delivery Note At 4:07 AM a viable female was delivered via Vaginal, Spontaneous Delivery (Presentation: ; Occiput Anterior).  APGAR: 7, 9; weight 10 lb 4.2 oz (4655 g).    Delivery of shoulders done by Hansel Feinstein CNM as follows: Head emerged slowly. Staff RNs were readied for possible shoulder dystocia and time marked Unable to deliver anterior shoulder McRoberts employed with another attempt.  I was able to hook posterior axilla (Left arm) but could not deliver posterior shoulder Swept around and located anterior axilla and with suprapubic pressure was able to deliver anterior shoulder (R arm) Cord clamped and cut and baby handed to RNs for assessment.  Baby immediately cried and had good tone.  Total time 1.5 minutes  Placenta status: Intact, Spontaneous.  Cord: 3v with the following complications: None.  Cord pH: na  Anesthesia: Epidural  Episiotomy: None Lacerations: 2nd degree;Perineal, assessed by Dr Kennon Rounds who agrees it is a second degree with no extension into capsule Suture Repair: 3.0 monocryl Est. Blood Loss (mL):  300  Mom to postpartum.  Baby to Couplet care / Skin to Skin.  [employer]/gallery conservator (delivery of shoulders by Hansel Feinstein CNM) 07/15/2014, 4:54 AM  Agree with note Seabron Spates, CNM

## 2014-06-15 ENCOUNTER — Ambulatory Visit (INDEPENDENT_AMBULATORY_CARE_PROVIDER_SITE_OTHER): Payer: Managed Care, Other (non HMO) | Admitting: Advanced Practice Midwife

## 2014-06-15 VITALS — BP 126/78 | HR 83

## 2014-06-15 DIAGNOSIS — Z36 Encounter for antenatal screening of mother: Secondary | ICD-10-CM

## 2014-06-15 DIAGNOSIS — Z3493 Encounter for supervision of normal pregnancy, unspecified, third trimester: Secondary | ICD-10-CM

## 2014-06-15 LAB — OB RESULTS CONSOLE GBS: GBS: NEGATIVE

## 2014-06-15 NOTE — Patient Instructions (Signed)
Braxton Hicks Contractions °Contractions of the uterus can occur throughout pregnancy. Contractions are not always a sign that you are in labor.  °WHAT ARE BRAXTON HICKS CONTRACTIONS?  °Contractions that occur before labor are called Braxton Hicks contractions, or false labor. Toward the end of pregnancy (32-34 weeks), these contractions can develop more often and may become more forceful. This is not true labor because these contractions do not result in opening (dilatation) and thinning of the cervix. They are sometimes difficult to tell apart from true labor because these contractions can be forceful and people have different pain tolerances. You should not feel embarrassed if you go to the hospital with false labor. Sometimes, the only way to tell if you are in true labor is for your health care provider to look for changes in the cervix. °If there are no prenatal problems or other health problems associated with the pregnancy, it is completely safe to be sent home with false labor and await the onset of true labor. °HOW CAN YOU TELL THE DIFFERENCE BETWEEN TRUE AND FALSE LABOR? °False Labor °· The contractions of false labor are usually shorter and not as hard as those of true labor.   °· The contractions are usually irregular.   °· The contractions are often felt in the front of the lower abdomen and in the groin.   °· The contractions may go away when you walk around or change positions while lying down.   °· The contractions get weaker and are shorter lasting as time goes on.   °· The contractions do not usually become progressively stronger, regular, and closer together as with true labor.   °True Labor °1. Contractions in true labor last 30-70 seconds, become very regular, usually become more intense, and increase in frequency.   °2. The contractions do not go away with walking.   °3. The discomfort is usually felt in the top of the uterus and spreads to the lower abdomen and low back.   °4. True labor can  be determined by your health care provider with an exam. This will show that the cervix is dilating and getting thinner.   °WHAT TO REMEMBER °· Keep up with your usual exercises and follow other instructions given by your health care provider.   °· Take medicines as directed by your health care provider.   °· Keep your regular prenatal appointments.   °· Eat and drink lightly if you think you are going into labor.   °· If Braxton Hicks contractions are making you uncomfortable:   °· Change your position from lying down or resting to walking, or from walking to resting.   °· Sit and rest in a tub of warm water.   °· Drink 2-3 glasses of water. Dehydration may cause these contractions.   °· Do slow and deep breathing several times an hour.   °WHEN SHOULD I SEEK IMMEDIATE MEDICAL CARE? °Seek immediate medical care if: °· Your contractions become stronger, more regular, and closer together.   °· You have fluid leaking or gushing from your vagina.   °· You have a fever.   °· You pass blood-tinged mucus.   °· You have vaginal bleeding.   °· You have continuous abdominal pain.   °· You have low back pain that you never had before.   °· You feel your baby's head pushing down and causing pelvic pressure.   °· Your baby is not moving as much as it used to.   °Document Released: 05/29/2005 Document Revised: 06/03/2013 Document Reviewed: 03/10/2013 °[employer]® Patient Information ©2015 [employer]. This information is not intended to replace advice given to you by your health care   provider. Make sure you discuss any questions you have with your health care provider. ° °Fetal Movement Counts °Patient Name: __________________________________________________ Patient Due Date: ____________________ °Performing a fetal movement count is highly recommended in high-risk pregnancies, but it is good for every pregnant woman to do. Your health care provider may ask you to start counting fetal movements at 28 weeks of the pregnancy. Fetal  movements often increase: °· After eating a full meal. °· After physical activity. °· After eating or drinking something sweet or cold. °· At rest. °Pay attention to when you feel the baby is most active. This will help you notice a pattern of your baby's sleep and wake cycles and what factors contribute to an increase in fetal movement. It is important to perform a fetal movement count at the same time each day when your baby is normally most active.  °HOW TO COUNT FETAL MOVEMENTS °5. Find a quiet and comfortable area to sit or lie down on your left side. Lying on your left side provides the best blood and oxygen circulation to your baby. °6. Write down the day and time on a sheet of paper or in a journal. °7. Start counting kicks, flutters, swishes, rolls, or jabs in a 2-hour period. You should feel at least 10 movements within 2 hours. °8. If you do not feel 10 movements in 2 hours, wait 2-3 hours and count again. Look for a change in the pattern or not enough counts in 2 hours. °SEEK MEDICAL CARE IF: °· You feel less than 10 counts in 2 hours, tried twice. °· There is no movement in over an hour. °· The pattern is changing or taking longer each day to reach 10 counts in 2 hours. °· You feel the baby is not moving as he or she usually does. °Date: ____________ Movements: ____________ Start time: ____________ Finish time: ____________  °Date: ____________ Movements: ____________ Start time: ____________ Finish time: ____________ °Date: ____________ Movements: ____________ Start time: ____________ Finish time: ____________ °Date: ____________ Movements: ____________ Start time: ____________ Finish time: ____________ °Date: ____________ Movements: ____________ Start time: ____________ Finish time: ____________ °Date: ____________ Movements: ____________ Start time: ____________ Finish time: ____________ °Date: ____________ Movements: ____________ Start time: ____________ Finish time: ____________ °Date: ____________  Movements: ____________ Start time: ____________ Finish time: ____________  °Date: ____________ Movements: ____________ Start time: ____________ Finish time: ____________ °Date: ____________ Movements: ____________ Start time: ____________ Finish time: ____________ °Date: ____________ Movements: ____________ Start time: ____________ Finish time: ____________ °Date: ____________ Movements: ____________ Start time: ____________ Finish time: ____________ °Date: ____________ Movements: ____________ Start time: ____________ Finish time: ____________ °Date: ____________ Movements: ____________ Start time: ____________ Finish time: ____________ °Date: ____________ Movements: ____________ Start time: ____________ Finish time: ____________  °Date: ____________ Movements: ____________ Start time: ____________ Finish time: ____________ °Date: ____________ Movements: ____________ Start time: ____________ Finish time: ____________ °Date: ____________ Movements: ____________ Start time: ____________ Finish time: ____________ °Date: ____________ Movements: ____________ Start time: ____________ Finish time: ____________ °Date: ____________ Movements: ____________ Start time: ____________ Finish time: ____________ °Date: ____________ Movements: ____________ Start time: ____________ Finish time: ____________ °Date: ____________ Movements: ____________ Start time: ____________ Finish time: ____________  °Date: ____________ Movements: ____________ Start time: ____________ Finish time: ____________ °Date: ____________ Movements: ____________ Start time: ____________ Finish time: ____________ °Date: ____________ Movements: ____________ Start time: ____________ Finish time: ____________ °Date: ____________ Movements: ____________ Start time: ____________ Finish time: ____________ °Date: ____________ Movements: ____________ Start time: ____________ Finish time: ____________ °Date: ____________ Movements: ____________ Start time:  ____________ Finish time: ____________ °Date: ____________ Movements:   ____________ Start time: ____________ Finish time: ____________  °Date: ____________ Movements: ____________ Start time: ____________ Finish time: ____________ °Date: ____________ Movements: ____________ Start time: ____________ Finish time: ____________ °Date: ____________ Movements: ____________ Start time: ____________ Finish time: ____________ °Date: ____________ Movements: ____________ Start time: ____________ Finish time: ____________ °Date: ____________ Movements: ____________ Start time: ____________ Finish time: ____________ °Date: ____________ Movements: ____________ Start time: ____________ Finish time: ____________ °Date: ____________ Movements: ____________ Start time: ____________ Finish time: ____________  °Date: ____________ Movements: ____________ Start time: ____________ Finish time: ____________ °Date: ____________ Movements: ____________ Start time: ____________ Finish time: ____________ °Date: ____________ Movements: ____________ Start time: ____________ Finish time: ____________ °Date: ____________ Movements: ____________ Start time: ____________ Finish time: ____________ °Date: ____________ Movements: ____________ Start time: ____________ Finish time: ____________ °Date: ____________ Movements: ____________ Start time: ____________ Finish time: ____________ °Date: ____________ Movements: ____________ Start time: ____________ Finish time: ____________  °Date: ____________ Movements: ____________ Start time: ____________ Finish time: ____________ °Date: ____________ Movements: ____________ Start time: ____________ Finish time: ____________ °Date: ____________ Movements: ____________ Start time: ____________ Finish time: ____________ °Date: ____________ Movements: ____________ Start time: ____________ Finish time: ____________ °Date: ____________ Movements: ____________ Start time: ____________ Finish time: ____________ °Date:  ____________ Movements: ____________ Start time: ____________ Finish time: ____________ °Date: ____________ Movements: ____________ Start time: ____________ Finish time: ____________  °Date: ____________ Movements: ____________ Start time: ____________ Finish time: ____________ °Date: ____________ Movements: ____________ Start time: ____________ Finish time: ____________ °Date: ____________ Movements: ____________ Start time: ____________ Finish time: ____________ °Date: ____________ Movements: ____________ Start time: ____________ Finish time: ____________ °Date: ____________ Movements: ____________ Start time: ____________ Finish time: ____________ °Date: ____________ Movements: ____________ Start time: ____________ Finish time: ____________ °Document Released: 06/28/2006 Document Revised: 10/13/2013 Document Reviewed: 03/25/2012 °[employer]® Patient Information ©2015 [employer]. This information is not intended to replace advice given to you by your health care provider. Make sure you discuss any questions you have with your health care provider. ° °

## 2014-06-15 NOTE — Progress Notes (Signed)
Cultures today

## 2014-06-18 LAB — CULTURE, BETA STREP (GROUP B ONLY)

## 2014-06-20 LAB — GC/CHLAMYDIA PROBE AMP
CT Probe RNA: NEGATIVE
GC Probe RNA: NEGATIVE

## 2014-06-23 ENCOUNTER — Ambulatory Visit (INDEPENDENT_AMBULATORY_CARE_PROVIDER_SITE_OTHER): Payer: Managed Care, Other (non HMO) | Admitting: Obstetrics & Gynecology

## 2014-06-23 VITALS — BP 138/85 | HR 100 | Wt 185.0 lb

## 2014-06-23 DIAGNOSIS — Z3491 Encounter for supervision of normal pregnancy, unspecified, first trimester: Secondary | ICD-10-CM

## 2014-06-23 DIAGNOSIS — Z3481 Encounter for supervision of other normal pregnancy, first trimester: Secondary | ICD-10-CM

## 2014-06-23 NOTE — Progress Notes (Signed)
Routine visit.Good FM. No problems. Labor precautions reviewed.

## 2014-06-30 ENCOUNTER — Encounter: Payer: Self-pay | Admitting: Obstetrics & Gynecology

## 2014-06-30 ENCOUNTER — Ambulatory Visit (INDEPENDENT_AMBULATORY_CARE_PROVIDER_SITE_OTHER): Payer: Managed Care, Other (non HMO) | Admitting: Obstetrics & Gynecology

## 2014-06-30 ENCOUNTER — Encounter: Payer: Self-pay | Admitting: *Deleted

## 2014-06-30 VITALS — BP 141/88 | HR 92 | Wt 193.0 lb

## 2014-06-30 DIAGNOSIS — O133 Gestational [pregnancy-induced] hypertension without significant proteinuria, third trimester: Secondary | ICD-10-CM

## 2014-06-30 DIAGNOSIS — Z3403 Encounter for supervision of normal first pregnancy, third trimester: Secondary | ICD-10-CM

## 2014-06-30 DIAGNOSIS — Z3491 Encounter for supervision of normal pregnancy, unspecified, first trimester: Secondary | ICD-10-CM

## 2014-06-30 NOTE — Progress Notes (Signed)
Routine visit. Good FM. She has had visual changes her whole life. Denies headache, RUQ pain. DTR of LEs are 0 bilaterally. Labor precautions reviewed. AFI 19. Check CBC, Cmeta, 24 urine. Pre eclampsia precautions. She declines any talk of IOL.

## 2014-07-01 LAB — COMPREHENSIVE METABOLIC PANEL
ALK PHOS: 186 U/L — AB (ref 39–117)
ALT: 11 U/L (ref 0–35)
AST: 18 U/L (ref 0–37)
Albumin: 3.4 g/dL — ABNORMAL LOW (ref 3.5–5.2)
BILIRUBIN TOTAL: 0.6 mg/dL (ref 0.2–1.2)
BUN: 5 mg/dL — AB (ref 6–23)
CHLORIDE: 105 meq/L (ref 96–112)
CO2: 26 mEq/L (ref 19–32)
Calcium: 9 mg/dL (ref 8.4–10.5)
Creat: 0.6 mg/dL (ref 0.50–1.10)
GLUCOSE: 66 mg/dL — AB (ref 70–99)
POTASSIUM: 3.9 meq/L (ref 3.5–5.3)
Sodium: 140 mEq/L (ref 135–145)
TOTAL PROTEIN: 6.2 g/dL (ref 6.0–8.3)

## 2014-07-01 LAB — CBC
HEMATOCRIT: 38.2 % (ref 36.0–46.0)
HEMOGLOBIN: 13.3 g/dL (ref 12.0–15.0)
MCH: 33.6 pg (ref 26.0–34.0)
MCHC: 34.8 g/dL (ref 30.0–36.0)
MCV: 96.5 fL (ref 78.0–100.0)
MPV: 9 fL (ref 8.6–12.4)
Platelets: 174 10*3/uL (ref 150–400)
RBC: 3.96 MIL/uL (ref 3.87–5.11)
RDW: 13.6 % (ref 11.5–15.5)
WBC: 10.6 10*3/uL — AB (ref 4.0–10.5)

## 2014-07-03 LAB — CREATININE CLEARANCE, URINE, 24 HOUR
CREATININE, URINE: 93.4 mg/dL
Creatinine Clearance: 165 mL/min — ABNORMAL HIGH (ref 75–115)
Creatinine, 24H Ur: 1494 mg/d (ref 700–1800)
Creatinine: 0.63 mg/dL (ref 0.50–1.10)

## 2014-07-03 LAB — PROTEIN, URINE, 24 HOUR
Protein, 24H Urine: 240 mg/d — ABNORMAL HIGH (ref <150)
Protein, Urine: 15 mg/dL (ref 5–24)

## 2014-07-06 ENCOUNTER — Ambulatory Visit (INDEPENDENT_AMBULATORY_CARE_PROVIDER_SITE_OTHER): Payer: Managed Care, Other (non HMO) | Admitting: Advanced Practice Midwife

## 2014-07-06 VITALS — BP 138/86 | HR 109 | Wt 194.0 lb

## 2014-07-06 DIAGNOSIS — Z3493 Encounter for supervision of normal pregnancy, unspecified, third trimester: Secondary | ICD-10-CM

## 2014-07-06 NOTE — Progress Notes (Signed)
Wants to have a cervical check

## 2014-07-06 NOTE — Patient Instructions (Signed)

## 2014-07-07 NOTE — Progress Notes (Signed)
Signs of labor reviewed. Asked at what point would the postdue baby be at risk. Discussed risk of stillbirth with postdates. Will schedule NST for late this week and recommend surveillance if declines postdates IOL

## 2014-07-10 ENCOUNTER — Ambulatory Visit: Payer: Managed Care, Other (non HMO) | Admitting: *Deleted

## 2014-07-10 ENCOUNTER — Ambulatory Visit (INDEPENDENT_AMBULATORY_CARE_PROVIDER_SITE_OTHER): Payer: Managed Care, Other (non HMO) | Admitting: Advanced Practice Midwife

## 2014-07-10 VITALS — BP 135/83 | HR 97 | Wt 194.0 lb

## 2014-07-10 DIAGNOSIS — O480 Post-term pregnancy: Secondary | ICD-10-CM

## 2014-07-10 DIAGNOSIS — Z3491 Encounter for supervision of normal pregnancy, unspecified, first trimester: Secondary | ICD-10-CM

## 2014-07-10 DIAGNOSIS — Z3493 Encounter for supervision of normal pregnancy, unspecified, third trimester: Secondary | ICD-10-CM

## 2014-07-10 DIAGNOSIS — Z3481 Encounter for supervision of other normal pregnancy, first trimester: Secondary | ICD-10-CM

## 2014-07-10 NOTE — Progress Notes (Signed)
NST reactive today. Membranes swept. Tried to call attending to ask if AFI neded to be repeated today due to Hx elevated AFI. Dr. Elly Modena In [place]. Will call back and call Pt PRN. Otherwise plan IOL at 41 weeks, 07/14/14. Would like to try to avoid pitocin so that she can have waterbirth. May be good candidate for AROM.

## 2014-07-10 NOTE — Patient Instructions (Signed)
Labor Induction  Labor induction is when steps are taken to cause a pregnant woman to begin the labor process. Most women go into labor on their own between 37 weeks and 42 weeks of the pregnancy. When this does not happen or when there is a medical need, methods may be used to induce labor. Labor induction causes a pregnant woman's uterus to contract. It also causes the cervix to soften (ripen), open (dilate), and thin out (efface). Usually, labor is not induced before 39 weeks of the pregnancy unless there is a problem with the baby or mother.  Before inducing labor, your health care provider will consider a number of factors, including the following:  The medical condition of you and the baby.   How many weeks along you are.   The status of the baby's lung maturity.   The condition of the cervix.   The position of the baby.  WHAT ARE THE REASONS FOR LABOR INDUCTION? Labor may be induced for the following reasons: 1. The health of the baby or mother is at risk.  2. The pregnancy is overdue by 1 week or more.  3. The water breaks but labor does not start on its own.  4. The mother has a health condition or serious illness, such as high blood pressure, infection, placental abruption, or diabetes. 5. The amniotic fluid amounts are low around the baby.  6. The baby is distressed.  Convenience or wanting the baby to be born on a certain date is not a reason for inducing labor. WHAT METHODS ARE USED FOR LABOR INDUCTION? Several methods of labor induction may be used, such as:   Prostaglandin medicine. This medicine causes the cervix to dilate and ripen. The medicine will also start contractions. It can be taken by mouth or by inserting a suppository into the vagina.   Inserting a thin tube (catheter) with a balloon on the end into the vagina to dilate the cervix. Once inserted, the balloon is expanded with water, which causes the cervix to open.   Stripping the membranes. Your  health care provider separates amniotic sac tissue from the cervix, causing the cervix to be stretched and causing the release of a hormone called progesterone. This may cause the uterus to contract. It is often done during an office visit. You will be sent home to wait for the contractions to begin. You will then come in for an induction.   Breaking the water. Your health care provider makes a hole in the amniotic sac using a small instrument. Once the amniotic sac breaks, contractions should begin. This may still take hours to see an effect.   Medicine to trigger or strengthen contractions. This medicine is given through an IV access tube inserted into a vein in your arm.  All of the methods of induction, besides stripping the membranes, will be done in the hospital. Induction is done in the hospital so that you and the baby can be carefully monitored.  HOW LONG DOES IT TAKE FOR LABOR TO BE INDUCED? Some inductions can take up to 2-3 days. Depending on the cervix, it usually takes less time. It takes longer when you are induced early in the pregnancy or if this is your first pregnancy. If a mother is still pregnant and the induction has been going on for 2-3 days, either the mother will be sent home or a cesarean delivery will be needed. WHAT ARE THE RISKS ASSOCIATED WITH LABOR INDUCTION? Some of the risks of induction  include:   Changes in fetal heart rate, such as too high, too low, or erratic.   Fetal distress.   Chance of infection for the mother and baby.   Increased chance of having a cesarean delivery.   Breaking off (abruption) of the placenta from the uterus (rare).   Uterine rupture (very rare).  When induction is needed for medical reasons, the benefits of induction may outweigh the risks. WHAT ARE SOME REASONS FOR NOT INDUCING LABOR? Labor induction should not be done if:   It is shown that your baby does not tolerate labor.   You have had previous surgeries on  your uterus, such as a myomectomy or the removal of fibroids.   Your placenta lies very low in the uterus and blocks the opening of the cervix (placenta previa).   Your baby is not in a head-down position.   The umbilical cord drops down into the birth canal in front of the baby. This could cut off the baby's blood and oxygen supply.   You have had a previous cesarean delivery.   There are unusual circumstances, such as the baby being extremely premature.  Document Released: 10/18/2006 Document Revised: 01/29/2013 Document Reviewed: 12/26/2012 [facility] Patient Information 2015 [place], [place]. This information is not intended to replace advice given to you by your health care provider. Make sure you discuss any questions you have with your health care provider.  Fetal Movement Counts Patient Name: __________________________________________________ Patient Due Date: ____________________ Performing a fetal movement count is highly recommended in high-risk pregnancies, but it is good for every pregnant woman to do. Your health care provider may ask you to start counting fetal movements at 28 weeks of the pregnancy. Fetal movements often increase:  After eating a full meal.  After physical activity.  After eating or drinking something sweet or cold.  At rest. Pay attention to when you feel the baby is most active. This will help you notice a pattern of your baby's sleep and wake cycles and what factors contribute to an increase in fetal movement. It is important to perform a fetal movement count at the same time each day when your baby is normally most active.  HOW TO COUNT FETAL MOVEMENTS 7. Find a quiet and comfortable area to sit or lie down on your left side. Lying on your left side provides the best blood and oxygen circulation to your baby. 8. Write down the day and time on a sheet of paper or in a journal. 9. Start counting kicks, flutters, swishes, rolls, or jabs in a 2-hour  period. You should feel at least 10 movements within 2 hours. 10. If you do not feel 10 movements in 2 hours, wait 2-3 hours and count again. Look for a change in the pattern or not enough counts in 2 hours. SEEK MEDICAL CARE IF:  You feel less than 10 counts in 2 hours, tried twice.  There is no movement in over an hour.  The pattern is changing or taking longer each day to reach 10 counts in 2 hours.  You feel the baby is not moving as he or she usually does. Date: ____________ Movements: ____________ Start time: ____________ Jennifer Cantrell time: ____________  Date: ____________ Movements: ____________ Start time: ____________ Jennifer Cantrell time: ____________ Date: ____________ Movements: ____________ Start time: ____________ Jennifer Cantrell time: ____________ Date: ____________ Movements: ____________ Start time: ____________ Jennifer Cantrell time: ____________ Date: ____________ Movements: ____________ Start time: ____________ Jennifer Cantrell time: ____________ Date: ____________ Movements: ____________ Start time: ____________ Jennifer Cantrell time: ____________  Date: ____________ Movements: ____________ Start time: ____________ Jennifer Cantrell time: ____________ Date: ____________ Movements: ____________ Start time: ____________ Jennifer Cantrell time: ____________  Date: ____________ Movements: ____________ Start time: ____________ Jennifer Cantrell time: ____________ Date: ____________ Movements: ____________ Start time: ____________ Jennifer Cantrell time: ____________ Date: ____________ Movements: ____________ Start time: ____________ Jennifer Cantrell time: ____________ Date: ____________ Movements: ____________ Start time: ____________ Jennifer Cantrell time: ____________ Date: ____________ Movements: ____________ Start time: ____________ Jennifer Cantrell time: ____________ Date: ____________ Movements: ____________ Start time: ____________ Jennifer Cantrell time: ____________ Date: ____________ Movements: ____________ Start time: ____________ Jennifer Cantrell time: ____________  Date: ____________ Movements: ____________  Start time: ____________ Jennifer Cantrell time: ____________ Date: ____________ Movements: ____________ Start time: ____________ Jennifer Cantrell time: ____________ Date: ____________ Movements: ____________ Start time: ____________ Jennifer Cantrell time: ____________ Date: ____________ Movements: ____________ Start time: ____________ Jennifer Cantrell time: ____________ Date: ____________ Movements: ____________ Start time: ____________ Jennifer Cantrell time: ____________ Date: ____________ Movements: ____________ Start time: ____________ Jennifer Cantrell time: ____________ Date: ____________ Movements: ____________ Start time: ____________ Jennifer Cantrell time: ____________  Date: ____________ Movements: ____________ Start time: ____________ Jennifer Cantrell time: ____________ Date: ____________ Movements: ____________ Start time: ____________ Jennifer Cantrell time: ____________ Date: ____________ Movements: ____________ Start time: ____________ Jennifer Cantrell time: ____________ Date: ____________ Movements: ____________ Start time: ____________ Jennifer Cantrell time: ____________ Date: ____________ Movements: ____________ Start time: ____________ Jennifer Cantrell time: ____________ Date: ____________ Movements: ____________ Start time: ____________ Jennifer Cantrell time: ____________ Date: ____________ Movements: ____________ Start time: ____________ Jennifer Cantrell time: ____________  Date: ____________ Movements: ____________ Start time: ____________ Jennifer Cantrell time: ____________ Date: ____________ Movements: ____________ Start time: ____________ Jennifer Cantrell time: ____________ Date: ____________ Movements: ____________ Start time: ____________ Jennifer Cantrell time: ____________ Date: ____________ Movements: ____________ Start time: ____________ Jennifer Cantrell time: ____________ Date: ____________ Movements: ____________ Start time: ____________ Jennifer Cantrell time: ____________ Date: ____________ Movements: ____________ Start time: ____________ Jennifer Cantrell time: ____________ Date: ____________ Movements: ____________ Start time: ____________ Jennifer Cantrell time:  ____________  Date: ____________ Movements: ____________ Start time: ____________ Jennifer Cantrell time: ____________ Date: ____________ Movements: ____________ Start time: ____________ Jennifer Cantrell time: ____________ Date: ____________ Movements: ____________ Start time: ____________ Jennifer Cantrell time: ____________ Date: ____________ Movements: ____________ Start time: ____________ Jennifer Cantrell time: ____________ Date: ____________ Movements: ____________ Start time: ____________ Jennifer Cantrell time: ____________ Date: ____________ Movements: ____________ Start time: ____________ Jennifer Cantrell time: ____________ Date: ____________ Movements: ____________ Start time: ____________ Jennifer Cantrell time: ____________  Date: ____________ Movements: ____________ Start time: ____________ Jennifer Cantrell time: ____________ Date: ____________ Movements: ____________ Start time: ____________ Jennifer Cantrell time: ____________ Date: ____________ Movements: ____________ Start time: ____________ Jennifer Cantrell time: ____________ Date: ____________ Movements: ____________ Start time: ____________ Jennifer Cantrell time: ____________ Date: ____________ Movements: ____________ Start time: ____________ Jennifer Cantrell time: ____________ Date: ____________ Movements: ____________ Start time: ____________ Jennifer Cantrell time: ____________ Date: ____________ Movements: ____________ Start time: ____________ Jennifer Cantrell time: ____________  Date: ____________ Movements: ____________ Start time: ____________ Jennifer Cantrell time: ____________ Date: ____________ Movements: ____________ Start time: ____________ Jennifer Cantrell time: ____________ Date: ____________ Movements: ____________ Start time: ____________ Jennifer Cantrell time: ____________ Date: ____________ Movements: ____________ Start time: ____________ Jennifer Cantrell time: ____________ Date: ____________ Movements: ____________ Start time: ____________ Jennifer Cantrell time: ____________ Date: ____________ Movements: ____________ Start time: ____________ Jennifer Cantrell time: ____________ Document Released: 06/28/2006  Document Revised: 10/13/2013 Document Reviewed: 03/25/2012 [employer] Patient Information 2015 [employer]. This information is not intended to replace advice given to you by your health care provider. Make sure you discuss any questions you have with your health care provider.

## 2014-07-10 NOTE — Progress Notes (Signed)
Pt having contractions on NST today

## 2014-07-13 ENCOUNTER — Encounter: Payer: Managed Care, Other (non HMO) | Admitting: Advanced Practice Midwife

## 2014-07-13 ENCOUNTER — Telehealth (HOSPITAL_COMMUNITY): Payer: Self-pay | Admitting: *Deleted

## 2014-07-13 NOTE — Telephone Encounter (Signed)
Preadmission screen  

## 2014-07-14 ENCOUNTER — Inpatient Hospital Stay (HOSPITAL_COMMUNITY)
Admission: RE | Admit: 2014-07-14 | Discharge: 2014-07-16 | DRG: 775 | Disposition: A | Discharge status: Home / Self Care | Payer: Managed Care, Other (non HMO) | Source: Ambulatory Visit | Attending: Family Medicine | Admitting: Family Medicine

## 2014-07-14 ENCOUNTER — Inpatient Hospital Stay (HOSPITAL_COMMUNITY): Payer: Managed Care, Other (non HMO) | Admitting: Anesthesiology

## 2014-07-14 ENCOUNTER — Encounter (HOSPITAL_COMMUNITY): Payer: Self-pay

## 2014-07-14 VITALS — BP 106/58 | HR 80 | Temp 97.8°F | Resp 16 | Ht 67.0 in | Wt 194.0 lb

## 2014-07-14 DIAGNOSIS — G43109 Migraine with aura, not intractable, without status migrainosus: Secondary | ICD-10-CM

## 2014-07-14 DIAGNOSIS — O480 Post-term pregnancy: Secondary | ICD-10-CM | POA: Diagnosis present

## 2014-07-14 DIAGNOSIS — Z3A41 41 weeks gestation of pregnancy: Secondary | ICD-10-CM | POA: Diagnosis present

## 2014-07-14 DIAGNOSIS — Z3403 Encounter for supervision of normal first pregnancy, third trimester: Secondary | ICD-10-CM | POA: Diagnosis present

## 2014-07-14 DIAGNOSIS — Z3491 Encounter for supervision of normal pregnancy, unspecified, first trimester: Secondary | ICD-10-CM

## 2014-07-14 DIAGNOSIS — R7309 Other abnormal glucose: Secondary | ICD-10-CM

## 2014-07-14 LAB — CBC
HCT: 37.1 % (ref 36.0–46.0)
HCT: 36 % (ref 36.0–46.0)
HEMOGLOBIN: 12.9 g/dL (ref 12.0–15.0)
HEMOGLOBIN: 12.4 g/dL (ref 12.0–15.0)
MCH: 33.8 pg (ref 26.0–34.0)
MCH: 33.4 pg (ref 26.0–34.0)
MCHC: 34.8 g/dL (ref 30.0–36.0)
MCHC: 34.4 g/dL (ref 30.0–36.0)
MCV: 97.1 fL (ref 78.0–100.0)
MCV: 97 fL (ref 78.0–100.0)
Platelets: 156 10*3/uL (ref 150–400)
Platelets: 145 10*3/uL — ABNORMAL LOW (ref 150–400)
RBC: 3.82 MIL/uL — ABNORMAL LOW (ref 3.87–5.11)
RBC: 3.71 MIL/uL — ABNORMAL LOW (ref 3.87–5.11)
RDW: 13.4 % (ref 11.5–15.5)
RDW: 13.3 % (ref 11.5–15.5)
WBC: 10.4 10*3/uL (ref 4.0–10.5)
WBC: 16.2 10*3/uL — ABNORMAL HIGH (ref 4.0–10.5)

## 2014-07-14 LAB — COMPREHENSIVE METABOLIC PANEL
ALT: 16 U/L (ref 0–35)
AST: 23 U/L (ref 0–37)
Albumin: 3.4 g/dL — ABNORMAL LOW (ref 3.5–5.2)
Alkaline Phosphatase: 210 U/L — ABNORMAL HIGH (ref 39–117)
Anion gap: 8 (ref 5–15)
BUN: 7 mg/dL (ref 6–23)
CO2: 20 mmol/L (ref 19–32)
Calcium: 9.1 mg/dL (ref 8.4–10.5)
Chloride: 110 mmol/L (ref 96–112)
Creatinine, Ser: 0.65 mg/dL (ref 0.50–1.10)
GFR calc Af Amer: >90 mL/min (ref >90)
GFR calc non Af Amer: >90 mL/min (ref >90)
Glucose, Bld: 103 mg/dL — ABNORMAL HIGH (ref 70–99)
Potassium: 3.8 mmol/L (ref 3.5–5.1)
Sodium: 138 mmol/L (ref 135–145)
Total Bilirubin: 0.8 mg/dL (ref 0.3–1.2)
Total Protein: 6.3 g/dL (ref 6.0–8.3)

## 2014-07-14 LAB — PROTEIN / CREATININE RATIO, URINE
Creatinine, Urine: 63 mg/dL
Protein Creatinine Ratio: 0.11 (ref 0.00–0.15)
Total Protein, Urine: 7 mg/dL

## 2014-07-14 LAB — TYPE AND SCREEN
ABO/RH(D): O POS
Antibody Screen: NEGATIVE

## 2014-07-14 LAB — ABO/RH: ABO/RH(D): O POS

## 2014-07-14 MED ORDER — LACTATED RINGERS IV SOLN: 500.0000 mL | INTRAVENOUS | Status: DC | PRN

## 2014-07-14 MED ORDER — PHENYLEPHRINE 40 MCG/ML (10ML) SYRINGE FOR IV PUSH (FOR BLOOD PRESSURE SUPPORT)
80.0000 ug | PREFILLED_SYRINGE | INTRAVENOUS | Status: DC | PRN
  Filled 2014-07-14: qty 2

## 2014-07-14 MED ORDER — LIDOCAINE HCL (PF) 1 % IJ SOLN
30.0000 mL | INTRAMUSCULAR | Status: DC | PRN
  Filled 2014-07-14: qty 30

## 2014-07-14 MED ORDER — MISOPROSTOL 200 MCG PO TABS
50.0000 ug | ORAL_TABLET | ORAL | Status: DC | PRN
  Administered 2014-07-14: 50 ug via ORAL
  Filled 2014-07-14 (×3): qty 0.5

## 2014-07-14 MED ORDER — DIPHENHYDRAMINE HCL 50 MG/ML IJ SOLN
12.5000 mg | INTRAMUSCULAR | Status: DC | PRN
Start: 2014-07-14 — End: 2014-07-15

## 2014-07-14 MED ORDER — TERBUTALINE SULFATE 1 MG/ML IJ SOLN: 0.2500 mg | Freq: Once | INTRAMUSCULAR | Status: AC | PRN

## 2014-07-14 MED ORDER — FENTANYL 2.5 MCG/ML BUPIVACAINE 1/10 % EPIDURAL INFUSION (WH - ANES)
14.0000 mL/h | INTRAMUSCULAR | Status: DC | PRN
  Administered 2014-07-14: 14 mL/h via EPIDURAL

## 2014-07-14 MED ORDER — EPHEDRINE 5 MG/ML INJ
10.0000 mg | INTRAVENOUS | Status: DC | PRN
  Filled 2014-07-14: qty 2

## 2014-07-14 MED ORDER — OXYTOCIN BOLUS FROM INFUSION
500.0000 mL | INTRAVENOUS | Status: DC
  Administered 2014-07-15: 500 mL via INTRAVENOUS

## 2014-07-14 MED ORDER — PHENYLEPHRINE 40 MCG/ML (10ML) SYRINGE FOR IV PUSH (FOR BLOOD PRESSURE SUPPORT)
80.0000 ug | PREFILLED_SYRINGE | INTRAVENOUS | Status: DC | PRN
Start: 2014-07-14 — End: 2014-07-15
  Filled 2014-07-14: qty 2
  Filled 2014-07-14: qty 20

## 2014-07-14 MED ORDER — FENTANYL CITRATE 0.05 MG/ML IJ SOLN
100.0000 ug | INTRAMUSCULAR | Status: DC | PRN
  Administered 2014-07-14: 100 ug via INTRAVENOUS
  Filled 2014-07-14: qty 2

## 2014-07-14 MED ORDER — CITRIC ACID-SODIUM CITRATE 334-500 MG/5ML PO SOLN: 30.0000 mL | ORAL | Status: DC | PRN

## 2014-07-14 MED ORDER — ONDANSETRON HCL 4 MG/2ML IJ SOLN: 4.0000 mg | Freq: Four times a day (QID) | INTRAMUSCULAR | Status: DC | PRN

## 2014-07-14 MED ORDER — LACTATED RINGERS IV SOLN
INTRAVENOUS | Status: DC
  Administered 2014-07-14 – 2014-07-15 (×3): via INTRAVENOUS

## 2014-07-14 MED ORDER — LACTATED RINGERS IV SOLN
500.0000 mL | Freq: Once | INTRAVENOUS | Status: AC
  Administered 2014-07-14: 500 mL via INTRAVENOUS

## 2014-07-14 MED ORDER — OXYTOCIN 40 UNITS IN LACTATED RINGERS INFUSION - SIMPLE MED: 1.0000 m[IU]/min | INTRAVENOUS | Status: DC

## 2014-07-14 MED ORDER — OXYCODONE-ACETAMINOPHEN 5-325 MG PO TABS: 1.0000 | ORAL_TABLET | ORAL | Status: DC | PRN

## 2014-07-14 MED ORDER — FLEET ENEMA 7-19 GM/118ML RE ENEM: 1.0000 | ENEMA | RECTAL | Status: DC | PRN

## 2014-07-14 MED ORDER — ACETAMINOPHEN 325 MG PO TABS: 650.0000 mg | ORAL_TABLET | ORAL | Status: DC | PRN

## 2014-07-14 MED ORDER — OXYCODONE-ACETAMINOPHEN 5-325 MG PO TABS: 2.0000 | ORAL_TABLET | ORAL | Status: DC | PRN

## 2014-07-14 MED ORDER — FENTANYL 2.5 MCG/ML BUPIVACAINE 1/10 % EPIDURAL INFUSION (WH - ANES)
14.0000 mL/h | INTRAMUSCULAR | Status: DC | PRN
  Administered 2014-07-14: 14 mL/h via EPIDURAL
  Filled 2014-07-14: qty 125

## 2014-07-14 MED ORDER — OXYTOCIN 40 UNITS IN LACTATED RINGERS INFUSION - SIMPLE MED
62.5000 mL/h | INTRAVENOUS | Status: DC
  Filled 2014-07-14: qty 1000

## 2014-07-14 MED ORDER — LIDOCAINE HCL (PF) 1 % IJ SOLN
INTRAMUSCULAR | Status: DC | PRN
  Administered 2014-07-14: 6 mL
  Administered 2014-07-14: 4 mL

## 2014-07-14 NOTE — Progress Notes (Addendum)
LABOR PROGRESS NOTE  Jennifer Cantrell is a 24 y.o. G1P0 at [redacted]w[redacted]d  admitted for postdate ind of labor  Subjective: Wants water birth;   Objective: BP 140/82 mmHg  Pulse 87  Temp(Src) 98.5 F (36.9 C) (Oral)  Resp 18  Ht 5\' 7"  (1.702 m)  Wt 87.998 kg (194 lb)  BMI 30.38 kg/m2  SpO2 96%  LMP 09/24/2013 or  Filed Vitals:   07/14/14 1301 07/14/14 1422 07/14/14 1510 07/14/14 1514  BP:    140/82  Pulse:    87  Temp:      TempSrc:      Resp: 18 18 18    Height:      Weight:      SpO2:        Fht- Reassuring Cat 1 Uc's- q2 minutes mild to mod AROM- Large amount possible lt meconium; will continue to observe for mec Dilation: 3 Effacement (%): 50 Cervical Position: Middle Station: -2 Presentation: Vertex Exam by:: [employer], cnm  Labs: Lab Results  Component Value Date   WBC 10.4 07/14/2014   HGB 12.9 07/14/2014   HCT 37.1 07/14/2014   MCV 97.1 07/14/2014   PLT 156 07/14/2014    Patient Active Problem List   Diagnosis Date Noted  . Post term pregnancy over 40 weeks 07/14/2014  . Evaluate anatomy not seen on prior sonogram   . [redacted] weeks gestation of pregnancy   . Elevated glucose tolerance test 04/11/2014  . Migraine with aura 12/16/2013  . Supervision of normal pregnancy in first trimester 11/21/2013    Assessment / Plan: 24 y.o. G1P0 at [redacted]w[redacted]d here for postdate induction of labor;   Labor: Cytotec ind, GBS neg; cmp; urine c/p; Pt declines more cytotec at this time; Will recheck at 530 for change in dilation. Fetal Wellbeing:  Cat 1 Baseline 150 Pain Control:  natural Anticipated MOD:  Water birth anticipated vaginal delivery  Clemmons,Lori Grissett 07/14/2014, 3:36 PM

## 2014-07-14 NOTE — Progress Notes (Signed)
Jacquelyne Quarry is a 24 y.o. G1P0 at [redacted]w[redacted]d admitted for induction of labor due to postdates.  Subjective:   Objective: BP 125/69 mmHg  Pulse 101  Temp(Src) 98.3 F (36.8 C) (Oral)  Resp 18  Ht 5\' 7"  (1.702 m)  Wt 87.998 kg (194 lb)  BMI 30.38 kg/m2  SpO2 100%  LMP 09/24/2013      FHT:  FHR: 145 bpm, variability: moderate,  accelerations:  Present,  decelerations:  Absent UC:   regular, every 2 minutes SVE:   Dilation: 6 Effacement (%): 90 Station: -2 Exam by:: Honeycutt, RN  Labs: Lab Results  Component Value Date   WBC 16.2* 07/14/2014   HGB 12.4 07/14/2014   HCT 36.0 07/14/2014   MCV 97.0 07/14/2014   PLT 145* 07/14/2014    Assessment / Plan: IOL for postdates. Progressing well s/p cytotec.  Labor: Progressing well. Regular contractions and cervical change. No pitocin at this time. Preeclampsia:  no signs or symptoms of toxicity Fetal Wellbeing:  Category I Pain Control:  Epidural I/D:  GBS neg Anticipated MOD:  NSVD  Dannielle Huh 07/14/2014, 8:57 PM

## 2014-07-14 NOTE — H&P (Signed)
Jennifer Cantrell is a 24 y.o. female G1P0 presenting for postdate induction of labor @ [redacted] weeks gestation. She has had slightly elevated b/p's at last office visit and today. She failed her 1 hour Gtt and 1 value in her 3 hour Gtt.She desires a water birth if possible.  Maternal Medical History:  Reason for admission: Postdate induction of labor  Contractions: Frequency: irregular.    Fetal activity: Perceived fetal activity is normal.   Last perceived fetal movement was within the past hour.    Prenatal complications: No PIH.    Gestational hypertension OB History    Gravida Para Term Preterm AB TAB SAB Ectopic Multiple Living   1         0     History reviewed. No pertinent past medical history. Past Surgical History  Procedure Laterality Date  . Kidney surgery  1994  . Tonsillectomy and adenoidectomy     Family History: family history includes Cancer in her maternal grandfather and maternal grandmother; Diabetes in her maternal grandfather. Social History:  reports that she has never smoked. She has never used smokeless tobacco. She reports that she does not drink alcohol or use illicit drugs.   Review of Systems  Eyes: Negative for blurred vision.  Gastrointestinal: Negative for vomiting and abdominal pain.  Neurological: Negative for headaches.    Dilation: 3 Effacement (%): 50 Station: -2 Exam by:: Katherinne Mofield CNM Blood pressure 133/78, pulse 92, temperature 98 F (36.7 C), temperature source Oral, resp. rate 18, height 5\' 7"  (1.702 m), weight 87.998 kg (194 lb), last menstrual period 09/24/2013. Maternal Exam:  Uterine Assessment: Contraction strength is mild.  Contraction frequency is irregular.   Abdomen: Patient reports no abdominal tenderness. Fetal presentation: vertex  Introitus: Normal vagina.  Ferning test: not done.  Nitrazine test: not done. Amniotic fluid character: not assessed.  Pelvis: adequate for delivery.   Cervix: Cervix evaluated by digital exam.      Fetal Exam Fetal Monitor Review: Mode: ultrasound.   Baseline rate: 150.  Variability: moderate (6-25 bpm).   Pattern: accelerations present and no decelerations.    Fetal State Assessment: Category I - tracings are normal.     Physical Exam  Constitutional: She is oriented to person, place, and time. She appears well-developed and well-nourished.  HENT:  Head: Normocephalic and atraumatic.  Neck: Normal range of motion.  Cardiovascular: Normal rate.   Respiratory: Effort normal.  GI: Soft.  Genitourinary: Vagina normal.  Musculoskeletal: Normal range of motion.  Neurological: She is alert and oriented to person, place, and time. She has normal reflexes.  Skin: Skin is warm and dry.  Psychiatric: She has a normal mood and affect. Her behavior is normal. Judgment and thought content normal.    Prenatal labs: ABO, Rh: --/--/O POS (02/02 0830) Antibody: NEG (02/02 0830) Rubella: 3.23 (06/12 1042) RPR: NON REAC (10/30 1032)  HBsAg: NEGATIVE (06/12 1042)  HIV: NONREACTIVE (10/30 1032)  GBS: Negative (01/04 0000)  Considering waterbirth [facility]  Dating 7.3 weeks [place]  Genetic Screen  Declined  Anatomic [place]  Normal, but limited; rescan in 6-8 wks; marginal cord insertion-still limited, needs repeat  >> normal followup except subjectively increased amniotic fluid, needs repeat AFI in 2 weeks  GTT Early:               Third trimester: 155  >> 3 hr test  >> Only 1 of 4 values elevated (2 hr=161)  TDaP vaccine  05/06/14  Flu vaccine  04/28/14  GBS Neg  Contraception  family planning  Baby Food Breast  Circumcision  no  Pediatrician   Support Person Lee-Husband   Assessment/Plan: A: IUP @ 41 weeks     Postdate Induction     GBS negative P: Cytotec induction     AROM     Water Birth- Pt made aware that she may risk out of being a candidate for water birth and any time during labor course.      Anticipate Vaginal Delivery  Jennifer Cantrell 07/14/2014, 9:48  AM

## 2014-07-14 NOTE — Progress Notes (Signed)
Jennifer Cantrell is a 24 y.o. G1P0 at [redacted]w[redacted]d  admitted for induction of labor due to postdates.  Subjective: Pt feeling more pressure with contractions. No other complaints.  Objective: BP 136/79 mmHg  Pulse 92  Temp(Src) 98.1 F (36.7 C) (Oral)  Resp 18  Ht 5\' 7"  (1.702 m)  Wt 87.998 kg (194 lb)  BMI 30.38 kg/m2  SpO2 100%  LMP 09/24/2013      FHT:  FHR: 150 bpm, variability: moderate,  accelerations:  Present,  decelerations:  Absent UC:   regular, every 1-2 minutes SVE:   Dilation: 9 Effacement (%): 100 Station: 0 Exam by:: Fabio Neighbors, RN / Manus Rudd, MD  Labs: Lab Results  Component Value Date   WBC 16.2* 07/14/2014   HGB 12.4 07/14/2014   HCT 36.0 07/14/2014   MCV 97.0 07/14/2014   PLT 145* 07/14/2014    Assessment / Plan: IOL for postdates  Labor: Progressing normally Preeclampsia:  no signs or symptoms of toxicity Fetal Wellbeing:  Category I Pain Control:  Epidural I/D:  gbs neg Anticipated MOD:  NSVD  Jennifer Cantrell 07/14/2014, 11:14 PM

## 2014-07-14 NOTE — Progress Notes (Signed)
Notified by Dr. Deniece Ree that patient desires waterbirth.  Reviewed patient information.  Patient attended waterbirth in November 9191, certificate under media tab.  Hospital and research consents reviewed and signed today.  Copy of forms given to Ria Comment, RN to place in chart.  Pt has had one blood pressure of 140/65 since admission.  During prenatal course had a value of 141/88 at 39 weeks.  Labs to assess for preeclampsia were drawn and found to be normal.  Pt here today with no complaint of headache, vision changes or epigastric pain.  Plan to redraw labs (CBC, CMP, and Protein/Creatinine ratio).  If WNL and no other risk factors develop patient may labor in tub.  Provider will explain to patient that if diagnosed with preeclampsia or other contraindications develop a waterbirth will not be possible.

## 2014-07-14 NOTE — Anesthesia Preprocedure Evaluation (Signed)
Anesthesia Evaluation  Patient identified by MRN, date of birth, ID band Patient awake    Reviewed: Allergy & Precautions, H&P , Patient's Chart, lab work & pertinent test results  Airway Mallampati: II TM Distance: >3 FB Neck ROM: full    Dental  (+) Teeth Intact   Pulmonary  breath sounds clear to auscultation        Cardiovascular hypertension, Rhythm:regular Rate:Normal     Neuro/Psych    GI/Hepatic   Endo/Other    Renal/GU      Musculoskeletal   Abdominal   Peds  Hematology   Anesthesia Other Findings       Reproductive/Obstetrics (+) Pregnancy                          Anesthesia Physical Anesthesia Plan  ASA: II  Anesthesia Plan: Epidural   Post-op Pain Management:    Induction:   Airway Management Planned:   Additional Equipment:   Intra-op Plan:   Post-operative Plan:   Informed Consent: I have reviewed the patients History and Physical, chart, labs and discussed the procedure including the risks, benefits and alternatives for the proposed anesthesia with the patient or authorized representative who has indicated his/her understanding and acceptance.   Dental Advisory Given  Plan Discussed with:   Anesthesia Plan Comments: (Labs checked- platelets confirmed with RN in room. Fetal heart tracing, per RN, reported to be stable enough for sitting procedure. Discussed epidural, and patient consents to the procedure:  included risk of possible headache,backache, failed block, allergic reaction, and nerve injury. This patient was asked if she had any questions or concerns before the procedure started.)        Anesthesia Quick Evaluation  

## 2014-07-14 NOTE — Anesthesia Procedure Notes (Signed)

## 2014-07-15 ENCOUNTER — Encounter (HOSPITAL_COMMUNITY): Payer: Self-pay

## 2014-07-15 DIAGNOSIS — O480 Post-term pregnancy: Secondary | ICD-10-CM

## 2014-07-15 DIAGNOSIS — Z3A41 41 weeks gestation of pregnancy: Secondary | ICD-10-CM

## 2014-07-15 LAB — CBC
HEMATOCRIT: 31.5 % — AB (ref 36.0–46.0)
HEMOGLOBIN: 11.1 g/dL — AB (ref 12.0–15.0)
MCH: 34.4 pg — AB (ref 26.0–34.0)
MCHC: 35.2 g/dL (ref 30.0–36.0)
MCV: 97.5 fL (ref 78.0–100.0)
Platelets: 143 10*3/uL — ABNORMAL LOW (ref 150–400)
RBC: 3.23 MIL/uL — ABNORMAL LOW (ref 3.87–5.11)
RDW: 13.4 % (ref 11.5–15.5)
WBC: 17.1 10*3/uL — ABNORMAL HIGH (ref 4.0–10.5)

## 2014-07-15 MED ORDER — TETANUS-DIPHTH-ACELL PERTUSSIS 5-2.5-18.5 LF-MCG/0.5 IM SUSP: 0.5000 mL | Freq: Once | INTRAMUSCULAR | Status: DC

## 2014-07-15 MED ORDER — PRENATAL MULTIVITAMIN CH
1.0000 | ORAL_TABLET | Freq: Every day | ORAL | Status: DC
  Administered 2014-07-15 – 2014-07-16 (×2): 1 via ORAL
  Filled 2014-07-15 (×2): qty 1

## 2014-07-15 MED ORDER — BENZOCAINE-MENTHOL 20-0.5 % EX AERO: 1.0000 "application " | INHALATION_SPRAY | CUTANEOUS | Status: DC | PRN

## 2014-07-15 MED ORDER — ONDANSETRON HCL 4 MG PO TABS: 4.0000 mg | ORAL_TABLET | ORAL | Status: DC | PRN

## 2014-07-15 MED ORDER — ZOLPIDEM TARTRATE 5 MG PO TABS: 5.0000 mg | ORAL_TABLET | Freq: Every evening | ORAL | Status: DC | PRN

## 2014-07-15 MED ORDER — WITCH HAZEL-GLYCERIN EX PADS: 1.0000 "application " | MEDICATED_PAD | CUTANEOUS | Status: DC | PRN

## 2014-07-15 MED ORDER — SENNOSIDES-DOCUSATE SODIUM 8.6-50 MG PO TABS
2.0000 | ORAL_TABLET | ORAL | Status: DC
  Administered 2014-07-16: 2 via ORAL
  Filled 2014-07-15: qty 2

## 2014-07-15 MED ORDER — DIPHENHYDRAMINE HCL 25 MG PO CAPS: 25.0000 mg | ORAL_CAPSULE | Freq: Four times a day (QID) | ORAL | Status: DC | PRN

## 2014-07-15 MED ORDER — DIBUCAINE 1 % RE OINT: 1.0000 "application " | TOPICAL_OINTMENT | RECTAL | Status: DC | PRN

## 2014-07-15 MED ORDER — LANOLIN HYDROUS EX OINT: TOPICAL_OINTMENT | CUTANEOUS | Status: DC | PRN

## 2014-07-15 MED ORDER — SIMETHICONE 80 MG PO CHEW: 80.0000 mg | CHEWABLE_TABLET | ORAL | Status: DC | PRN

## 2014-07-15 MED ORDER — OXYCODONE-ACETAMINOPHEN 5-325 MG PO TABS: 2.0000 | ORAL_TABLET | ORAL | Status: DC | PRN

## 2014-07-15 MED ORDER — OXYCODONE-ACETAMINOPHEN 5-325 MG PO TABS: 1.0000 | ORAL_TABLET | ORAL | Status: DC | PRN

## 2014-07-15 MED ORDER — ONDANSETRON HCL 4 MG/2ML IJ SOLN: 4.0000 mg | INTRAMUSCULAR | Status: DC | PRN

## 2014-07-15 MED ORDER — IBUPROFEN 600 MG PO TABS
600.0000 mg | ORAL_TABLET | Freq: Four times a day (QID) | ORAL | Status: DC
  Administered 2014-07-15 – 2014-07-16 (×6): 600 mg via ORAL
  Filled 2014-07-15 (×6): qty 1

## 2014-07-15 NOTE — Plan of Care (Signed)
Problem: Phase I Progression Outcomes Goal: Voiding adequately Outcome: Progressing Awaiting 1st void since delivery

## 2014-07-15 NOTE — Progress Notes (Signed)
Patient ID: Jennifer Cantrell, female   DOB: 18-Jun-1990, 24 y.o.   MRN: [id number] Doing well, feeling some pressure  Filed Vitals:   07/15/14 0031 07/15/14 0100 07/15/14 0120 07/15/14 0131  BP: 131/81 143/84  140/86  Pulse: 97 113  100  Temp:   98.5 F (36.9 C)   TempSrc:   Oral   Resp: 18 18  18   Height:      Weight:      SpO2:       FHR reactive UCs every 1..5-2 min  Dilation: 10 Dilation Complete Date: 07/15/14 Dilation Complete Time: 0115 Effacement (%): 100 Cervical Position: Middle Station: +1 Presentation: Vertex Exam by:: Honeycutt, RN  Will labor down until fetal head is lower

## 2014-07-15 NOTE — Lactation Note (Signed)
This note was copied from the chart of [place]. Lactation Consultation Note: Asked by RN to see mom- baby is 10 lbs and fussy. Mom with flat nipples. RN has already started NS. Placed some formula in NS and baby latched well with NS. Mom reports some pinching- adjusted bottom lip and mom reports that feels better. Baby took about 5 cc's of formula and alert but no fussing now. Mom has shells and hand pump in room. Encouraged to get bra on and shells on. Mom has DEBP at home- dad is going to get it and bring to hospital. BF brochure given with resources for support after DC. No questions at present. To call for assist prn Patient Name: Jennifer Cantrell [id number] Date: 07/15/2014 Reason for consult: Initial assessment   Maternal Data Formula Feeding for Exclusion: No Has patient been taught Hand Expression?: Yes Does the patient have breastfeeding experience prior to this delivery?: No  Feeding Feeding Type: Breast Fed Length of feed: 15 min  LATCH Score/Interventions Latch: Grasps breast easily, tongue down, lips flanged, rhythmical sucking. (with NS) Intervention(s): Skin to skin;Teach feeding cues;Waking techniques Intervention(s): Adjust position;Assist with latch;Breast massage;Breast compression  Audible Swallowing: None Intervention(s): Skin to skin;Hand expression  Type of Nipple: Flat  Comfort (Breast/Nipple): Soft / non-tender     Hold (Positioning): Assistance needed to correctly position infant at breast and maintain latch. Intervention(s): Breastfeeding basics reviewed;Position options;Skin to skin  LATCH Score: 6  Lactation Tools Discussed/Used     Consult Status Consult Status: Follow-up Date: 07/16/14 Follow-up type: In-patient    Truddie Crumble 07/15/2014, 11:08 AM

## 2014-07-15 NOTE — Anesthesia Postprocedure Evaluation (Signed)
  Anesthesia Post-op Note  Patient: Jennifer Cantrell  Procedure(s) Performed: * No procedures listed *  Patient Location: Mother/Baby  Anesthesia Type:Epidural  Level of Consciousness: awake, alert , oriented and patient cooperative  Airway and Oxygen Therapy: Patient Spontanous Breathing  Post-op Pain: mild  Post-op Assessment: Post-op Vital signs reviewed, Patient's Cardiovascular Status Stable, Respiratory Function Stable, Patent Airway, No headache, No backache, No residual numbness and No residual motor weakness  Post-op Vital Signs: Reviewed and stable  Last Vitals:  Filed Vitals:   07/15/14 0730  BP: 117/56  Pulse: 96  Temp: 37.2 C  Resp: 20    Complications: No apparent anesthesia complications

## 2014-07-16 ENCOUNTER — Ambulatory Visit: Payer: Self-pay

## 2014-07-16 ENCOUNTER — Encounter (HOSPITAL_COMMUNITY): Payer: Self-pay

## 2014-07-16 LAB — CBC
HCT: 24.4 % — ABNORMAL LOW (ref 36.0–46.0)
Hemoglobin: 8.7 g/dL — ABNORMAL LOW (ref 12.0–15.0)
MCH: 34.7 pg — AB (ref 26.0–34.0)
MCHC: 35.7 g/dL (ref 30.0–36.0)
MCV: 97.2 fL (ref 78.0–100.0)
Platelets: 143 10*3/uL — ABNORMAL LOW (ref 150–400)
RBC: 2.51 MIL/uL — ABNORMAL LOW (ref 3.87–5.11)
RDW: 13.4 % (ref 11.5–15.5)
WBC: 14.4 10*3/uL — ABNORMAL HIGH (ref 4.0–10.5)

## 2014-07-16 NOTE — Discharge Instructions (Signed)

## 2014-07-16 NOTE — Lactation Note (Signed)
This note was copied from the chart of [name]. Lactation Consultation Note  Patient Name: Jennifer Cantrell [id number] Date: 07/16/2014   Visited with Mom, baby 66 hrs old.  Baby has been primarily getting formula via bottle, for the last 4 feedings.  Mom seated on couch using manual breast pump.  Offered to set up Symphony pump to maximize stimulation to the breast.  Set up pump with instructions on routine and cleaning of parts.  Encouraged skin to skin, and pumping each time baby would be fed at the breast, or every 2-3 hrs.  Offered assistance latching at next feeding.  Baby sound asleep on his back in his crib.  To follow up in am or prn.    [name] 07/16/2014, 6:14 PM

## 2014-07-16 NOTE — Discharge Summary (Signed)
Obstetric Discharge Summary Reason for Admission: induction of labor Prenatal Procedures: NST and ultrasound Intrapartum Procedures: spontaneous vaginal delivery Postpartum Procedures: none Complications-Operative and Postpartum: shoulder dystocia   Patient admitted for post-dates induction of labor with Cytotec.  Labor progressed normally, and patient advanced to second stage of labor.  After slow delivery of head, there was a 90 second shoulder dystocia.  Multiple maneuvers were performed, and the anterior shoulder was delivered with McRoberts, suprapubic pressure, and internal rotation.  Spontaneous delivery of intact placenta, and repair of 2nd degree laceration under epidural anesthesia.  EBL WNL.  Patient is breastfeeding, and plan NFP for contraception.  HEMOGLOBIN  Date Value Ref Range Status  07/16/2014 8.7* 12.0 - 15.0 g/dL Final    Comment:    DELTA CHECK NOTED REPEATED TO VERIFY    HCT  Date Value Ref Range Status  07/16/2014 24.4* 36.0 - 46.0 % Final    Physical Exam:  General: alert, cooperative and no distress Lochia: appropriate Uterine Fundus: firm Incision: n/a DVT Evaluation: No evidence of DVT seen on physical exam. Negative Homan's sign. No cords or calf tenderness. No significant calf/ankle edema.  Discharge Diagnoses: Term Pregnancy-delivered and Post-date pregnancy  Discharge Information: Date: 07/16/2014 Activity: pelvic rest Diet: routine Medications: PNV Condition: stable Instructions: refer to practice specific booklet Discharge to: home Follow-up Information    Follow up with [facility].   Specialty:  Obstetrics and Gynecology   Why:  4-6 weeks for postpartum visit   Contact information:   [place], [place] [phone]      Newborn Data: Live born female  Birth Weight: 10 lb 4.2 oz (4655 g) APGAR: 7, 9  Home with mother.  Jennifer Cantrell,Jennifer Cantrell 07/16/2014, 8:04  AM   I have seen and examined this patient and I agree with the above. Serita Grammes CNM 9:21 AM 07/16/2014

## 2014-07-16 NOTE — Progress Notes (Signed)
Post Partum Day #1 Subjective: no complaints, up ad lib, voiding, tolerating PO and + flatus  Objective: Blood pressure 135/73, pulse 94, temperature 97.9 F (36.6 C), temperature source Oral, resp. rate 18, height 5\' 7"  (1.702 m), weight 87.998 kg (194 lb), last menstrual period 09/24/2013, SpO2 100 %, unknown if currently breastfeeding.  Physical Exam:  General: alert, cooperative and no distress Lochia: appropriate Uterine Fundus: @U  Incision: n/a DVT Evaluation: No evidence of DVT seen on physical exam. Negative Homan's sign. No cords or calf tenderness. No significant calf/ankle edema.   Recent Labs  07/15/14 0442 07/16/14 0530  HGB 11.1* 8.7*  HCT 31.5* 24.4*    Assessment/Plan: Plan for discharge tomorrow, Breastfeeding and Contraception natural family planning   LOS: 2 days   Jennifer Cantrell 07/16/2014, 6:38 AM

## 2014-07-17 ENCOUNTER — Ambulatory Visit: Payer: Self-pay

## 2014-07-17 NOTE — Lactation Note (Signed)
This note was copied from the chart of [name]. Lactation Consultation Note: follow up visit with mom before DC. Mom has been giving bottles of formula through the night. Has pumped 3-4 times with DEBP- reports she used manual pump this morning and obtained a few cc's. States she wants to keep trying breast feeding. Encouraged to pump q 3 hours to promote a good milk supply. Offered OP appointment for assist with BF but states she has doula who is a [facility] who will help her. No questions at present. To call prn  Patient Name: Jennifer Cantrell [id number]'[id number] Date: 07/17/2014 Reason for consult: Follow-up assessment   Maternal Data    Feeding   LATCH Score/Interventions                      Lactation Tools Discussed/Used WIC Program: No   Consult Status Consult Status: Complete    Truddie Crumble 07/17/2014, 9:49 AM

## 2014-07-20 LAB — RPR: RPR: NONREACTIVE

## 2014-07-20 LAB — HIV ANTIBODY (ROUTINE TESTING W REFLEX): HIV SCREEN 4TH GENERATION: NONREACTIVE

## 2014-08-24 ENCOUNTER — Encounter: Payer: Self-pay | Admitting: Obstetrics & Gynecology

## 2014-08-24 ENCOUNTER — Ambulatory Visit (INDEPENDENT_AMBULATORY_CARE_PROVIDER_SITE_OTHER): Payer: Managed Care, Other (non HMO) | Admitting: Obstetrics & Gynecology

## 2014-08-24 VITALS — BP 136/69 | HR 78 | Resp 16 | Ht 67.0 in | Wt 161.0 lb

## 2014-08-24 DIAGNOSIS — Z3491 Encounter for supervision of normal pregnancy, unspecified, first trimester: Secondary | ICD-10-CM

## 2014-08-24 DIAGNOSIS — Z3481 Encounter for supervision of other normal pregnancy, first trimester: Secondary | ICD-10-CM

## 2014-08-24 NOTE — Progress Notes (Signed)
Patient ID: Jennifer Cantrell, female   DOB: [birth year], 24 y.o.   MRN: [id number] Post Partum Exam  Jennifer Cantrell is a 24 y.o. female who presents for a postpartum visit. She is 6 weeks postpartum following a SVD.She had wanted a water birth but baby needed continual monitoring. I have fully reviewed the prenatal and intrapartum course. The delivery was at 46 gestational weeks. Outcome: healthy baby boy weighing 10lb 4oz. Anesthesia:epidural. Postpartum course has been unremarkable. Baby's course has been unremarkable. Baby is feeding by bottle as her milk never came in and he had latching issues. Bleeding has stopped. Bowel function is . Bladder function is  Patient is not sexually active. Contraception method is going to use condoms. Postpartum depression screening: neg.  Pt did experience shoulder dystocia..  The following portions of the patient's history were reviewed and updated as appropriate: allergies, current medications, past family history, past medical history, past social history, past surgical history and problem list.  Review of Systems Pertinent items are noted in HPI.   Objective:    BP 116/78 mmHg  Pulse 78  Resp 16  Ht 5\' 5"  (1.651 m)  Wt 211 lb (95.709 kg)  BMI 35.11 kg/m2  Breastfeeding? Yes  General:  alert, cooperative and no distress   Breasts:  negative  Lungs: normal effort  Heart:  regular rate and rhythm  Abdomen: soft, non-tender; bowel sounds normal; no masses,  no organomegaly   Vulva:  normal  Vagina: small amount of inflammation at 6 o'clock where knot is.  Advise refraining from intercourse for 1 more week  Cervix:  no lesions  Corpus: not examined  Adnexa:  not evaluated  Rectal Exam: Not performed.        Assessment:    Nml postpartum exam.  Pap due in 18 months  Plan:    1. Contraception: condoms 2. Continue pumping 3.  LGA infant, borderline GDM 3. Follow up in: 1 year or as needed.

## 2014-08-24 NOTE — Patient Instructions (Signed)
Contraception Choices Contraception (birth control) is the use of any methods or devices to prevent pregnancy. Below are some methods to help avoid pregnancy. HORMONAL METHODS   Contraceptive implant. This is a thin, plastic tube containing progesterone hormone. It does not contain estrogen hormone. Your health care provider inserts the tube in the inner part of the upper arm. The tube can remain in place for up to 3 years. After 3 years, the implant must be removed. The implant prevents the ovaries from releasing an egg (ovulation), thickens the cervical mucus to prevent sperm from entering the uterus, and thins the lining of the inside of the uterus.  Progesterone-only injections. These injections are given every 3 months by your health care provider to prevent pregnancy. This synthetic progesterone hormone stops the ovaries from releasing eggs. It also thickens cervical mucus and changes the uterine lining. This makes it harder for sperm to survive in the uterus.  Birth control pills. These pills contain estrogen and progesterone hormone. They work by preventing the ovaries from releasing eggs (ovulation). They also cause the cervical mucus to thicken, preventing the sperm from entering the uterus. Birth control pills are prescribed by a health care provider.Birth control pills can also be used to treat heavy periods.  Minipill. This type of birth control pill contains only the progesterone hormone. They are taken every day of each month and must be prescribed by your health care provider.  Birth control patch. The patch contains hormones similar to those in birth control pills. It must be changed once a week and is prescribed by a health care provider.  Vaginal ring. The ring contains hormones similar to those in birth control pills. It is left in the vagina for 3 weeks, removed for 1 week, and then a new one is put back in place. The patient must be comfortable inserting and removing the ring  from the vagina.A health care provider's prescription is necessary.  Emergency contraception. Emergency contraceptives prevent pregnancy after unprotected sexual intercourse. This pill can be taken right after sex or up to 5 days after unprotected sex. It is most effective the sooner you take the pills after having sexual intercourse. Most emergency contraceptive pills are available without a prescription. Check with your pharmacist. Do not use emergency contraception as your only form of birth control. BARRIER METHODS   Female condom. This is a thin sheath (latex or rubber) that is worn over the penis during sexual intercourse. It can be used with spermicide to increase effectiveness.  Female condom. This is a soft, loose-fitting sheath that is put into the vagina before sexual intercourse.  Diaphragm. This is a soft, latex, dome-shaped barrier that must be fitted by a health care provider. It is inserted into the vagina, along with a spermicidal jelly. It is inserted before intercourse. The diaphragm should be left in the vagina for 6 to 8 hours after intercourse.  Cervical cap. This is a round, soft, latex or plastic cup that fits over the cervix and must be fitted by a health care provider. The cap can be left in place for up to 48 hours after intercourse.  Sponge. This is a soft, circular piece of polyurethane foam. The sponge has spermicide in it. It is inserted into the vagina after wetting it and before sexual intercourse.  Spermicides. These are chemicals that kill or block sperm from entering the cervix and uterus. They come in the form of creams, jellies, suppositories, foam, or tablets. They do not require a   prescription. They are inserted into the vagina with an applicator before having sexual intercourse. The process must be repeated every time you have sexual intercourse. INTRAUTERINE CONTRACEPTION  Intrauterine device (IUD). This is a T-shaped device that is put in a woman's uterus  during a menstrual period to prevent pregnancy. There are 2 types:  Copper IUD. This type of IUD is wrapped in copper wire and is placed inside the uterus. Copper makes the uterus and fallopian tubes produce a fluid that kills sperm. It can stay in place for 10 years.  Hormone IUD. This type of IUD contains the hormone progestin (synthetic progesterone). The hormone thickens the cervical mucus and prevents sperm from entering the uterus, and it also thins the uterine lining to prevent implantation of a fertilized egg. The hormone can weaken or kill the sperm that get into the uterus. It can stay in place for 3-5 years, depending on which type of IUD is used. PERMANENT METHODS OF CONTRACEPTION  Female tubal ligation. This is when the woman's fallopian tubes are surgically sealed, tied, or blocked to prevent the egg from traveling to the uterus.  Hysteroscopic sterilization. This involves placing a small coil or insert into each fallopian tube. Your doctor uses a technique called hysteroscopy to do the procedure. The device causes scar tissue to form. This results in permanent blockage of the fallopian tubes, so the sperm cannot fertilize the egg. It takes about 3 months after the procedure for the tubes to become blocked. You must use another form of birth control for these 3 months.  Female sterilization. This is when the female has the tubes that carry sperm tied off (vasectomy).This blocks sperm from entering the vagina during sexual intercourse. After the procedure, the man can still ejaculate fluid (semen). NATURAL PLANNING METHODS  Natural family planning. This is not having sexual intercourse or using a barrier method (condom, diaphragm, cervical cap) on days the woman could become pregnant.  Calendar method. This is keeping track of the length of each menstrual cycle and identifying when you are fertile.  Ovulation method. This is avoiding sexual intercourse during ovulation.  Symptothermal  method. This is avoiding sexual intercourse during ovulation, using a thermometer and ovulation symptoms.  Post-ovulation method. This is timing sexual intercourse after you have ovulated. Regardless of which type or method of contraception you choose, it is important that you use condoms to protect against the transmission of sexually transmitted infections (STIs). Talk with your health care provider about which form of contraception is most appropriate for you. Document Released: 05/29/2005 Document Revised: 06/03/2013 Document Reviewed: 11/21/2012 [employer] Patient Information 2015 [employer]. This information is not intended to replace advice given to you by your health care provider. Make sure you discuss any questions you have with your health care provider.  

## 2015-06-13 HISTORY — PX: LAPAROSCOPIC CHOLECYSTECTOMY: SUR755

## 2015-10-28 IMAGING — US US OB FOLLOW-UP
1 series · 12 of 28 positions shown · non-contrast
Comparison: none

OBSTETRICS REPORT
                      (Signed Final 03/20/2014 [DATE])

Service(s) Provided
 US OB FOLLOW UP                                       76816.1
Indications
 Evaluate anatomy not seen on prior sonogram           Z36
 24 weeks gestation of pregnancy
Fetal Evaluation
 Num Of Fetuses:    1
 Fetal Heart Rate:  149                          bpm
 Cardiac Activity:  Observed
 Presentation:      Breech
 Placenta:          Anterior, above cervical os
 P. Cord            Marginal insertion
 Insertion:
 Amniotic Fluid
 AFI FV:      Subjectively within normal limits
                                             Larg Pckt:     6.3  cm
Biometry
 BPD:     59.1  mm     G. Age:  24w 1d                CI:        66.56   70 - 86
                                                      FL/HC:      18.6   18.7 -
 HC:     232.2  mm     G. Age:  25w 2d       61  %    HC/AC:      1.13   1.05 -
 AC:     205.7  mm     G. Age:  25w 1d       64  %    FL/BPD:     73.1   71 - 87
 FL:      43.2  mm     G. Age:  24w 1d       29  %    FL/AC:      21.0   20 - 24
 Est. FW:     730  gm    1 lb 10 oz      58  %
Gestational Age
 U/S Today:     24w 5d                                        EDD:   07/05/14
 Best:          24w 3d     Det. By:  Early Ultrasound         EDD:   07/07/14
Anatomy
 Cranium:          Appears normal         Aortic Arch:      Not well visualized
 Fetal Cavum:      Previously seen        Ductal Arch:      Not well visualized
 Ventricles:       Previously seen        Diaphragm:        Appears normal
 Choroid Plexus:   Previously seen        Stomach:          Appears normal, left
                                                            sided
 Cerebellum:       Previously seen        Abdomen:          Appears normal
 Posterior Fossa:  Previously seen        Abdominal Wall:   Appears nml (cord
                                                            insert, abd wall)
 Nuchal Fold:      Previously seen        Cord Vessels:     Appears normal (3
                                                            vessel cord)
 Face:             Profile not well       Kidneys:          Appear normal
                   visualized
 Lips:             Previously seen        Bladder:          Appears normal
 Heart:            Not well visualized    Spine:            Previously seen
 RVOT:             Not well visualized    Lower             Previously seen
                                          Extremities:
 LVOT:             Appears normal         Upper             Previously seen
 Other:  Technically difficult due to fetal position. Fetus appears to be a male.
Cervix Uterus Adnexa
 Cervical Length:    3.4      cm
 Cervix:       Normal appearance by transabdominal scan.
 Uterus:       No abnormality visualized.
 Cul De Sac:   No free fluid seen.
 Left Ovary:    Within normal limits.
 Right Ovary:   Within normal limits.
 Adnexa:     No abnormality visualized.
Impression
INDICATION: 23 yr old G1P0 at 69w3d for follow up ultrasound
 to complete anatomy. Remote read.

[Series 1: us ob follow up · 55 acquisitions, 12 frames shown]
[im 3/55]
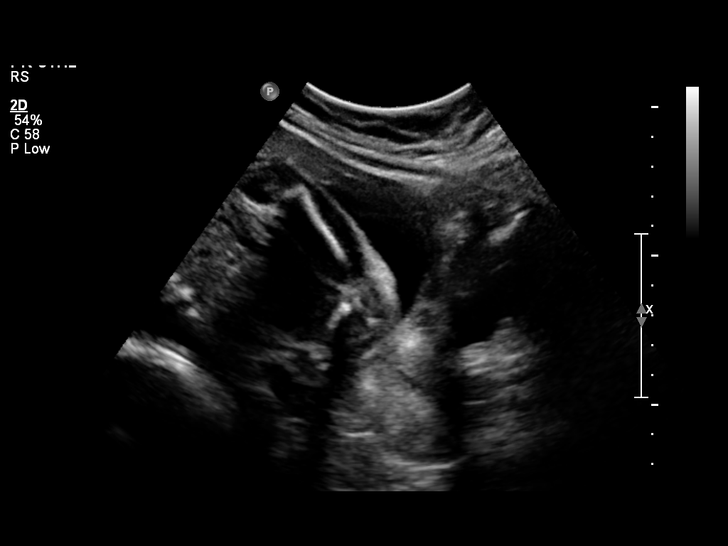
[im 7/55]
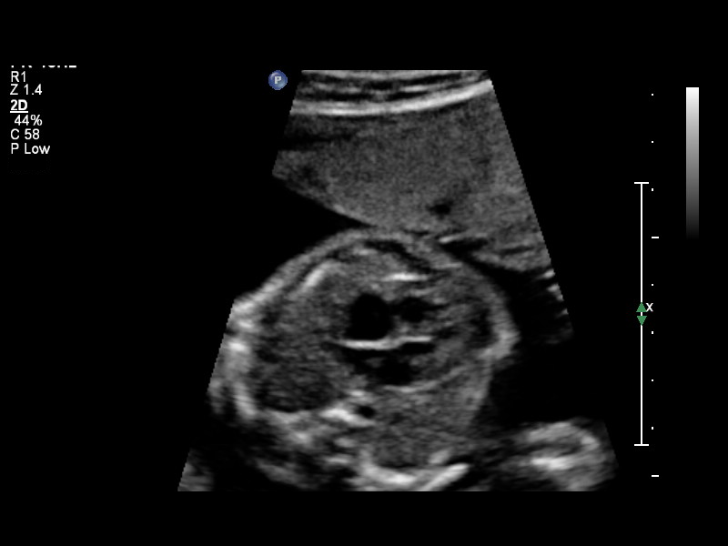
[im 11/55]
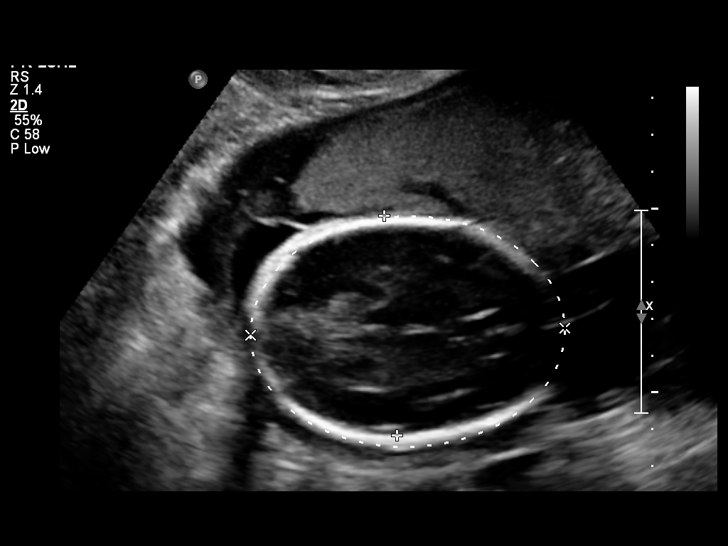
[im 17/55]
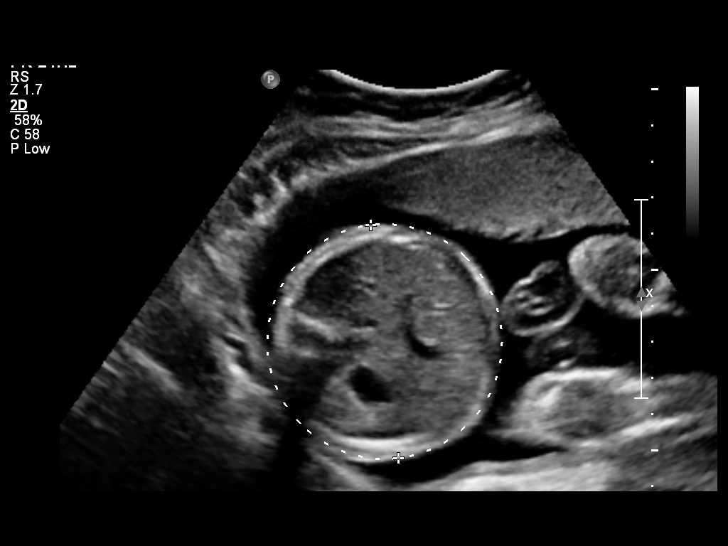
[im 21/55]
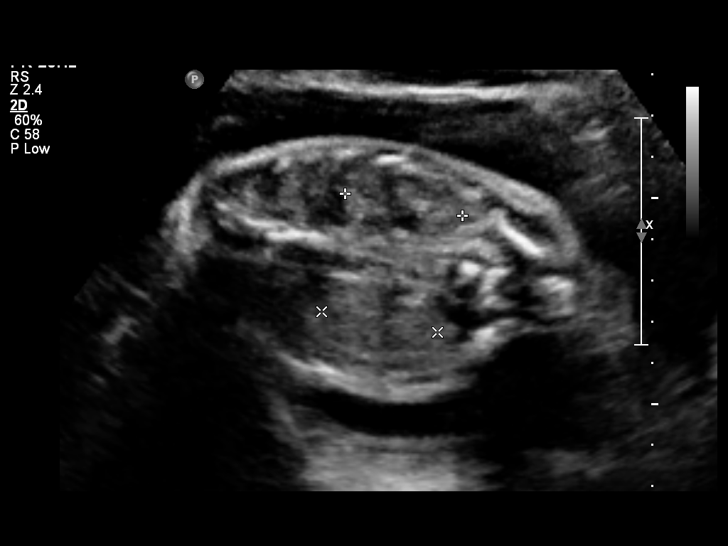
[im 25/55]
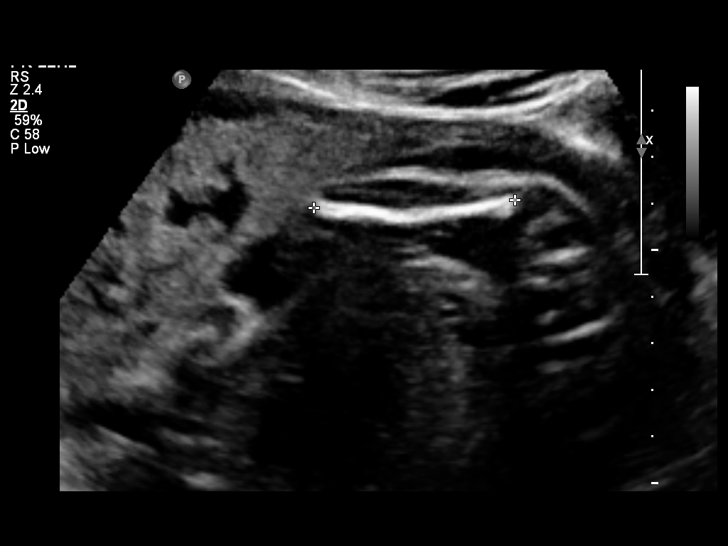
[im 31/55]
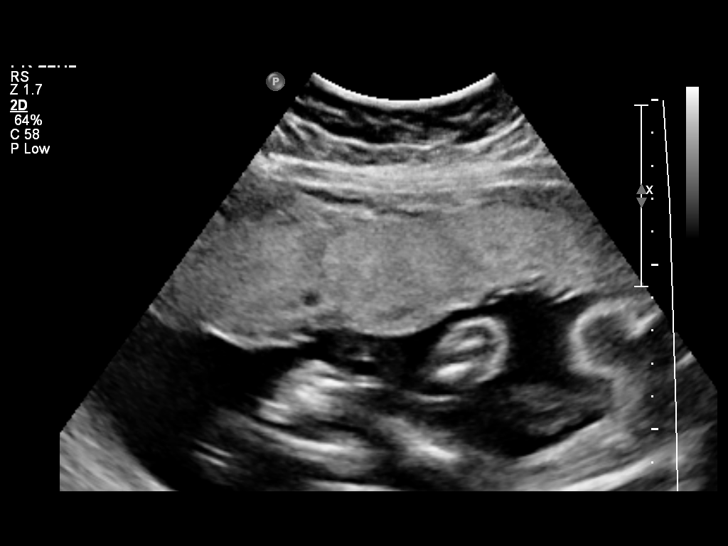
[im 35/55]
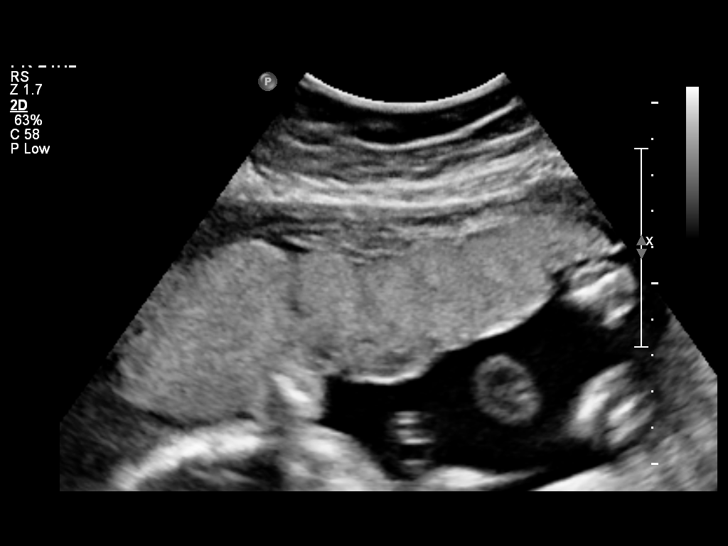
[im 39/55]
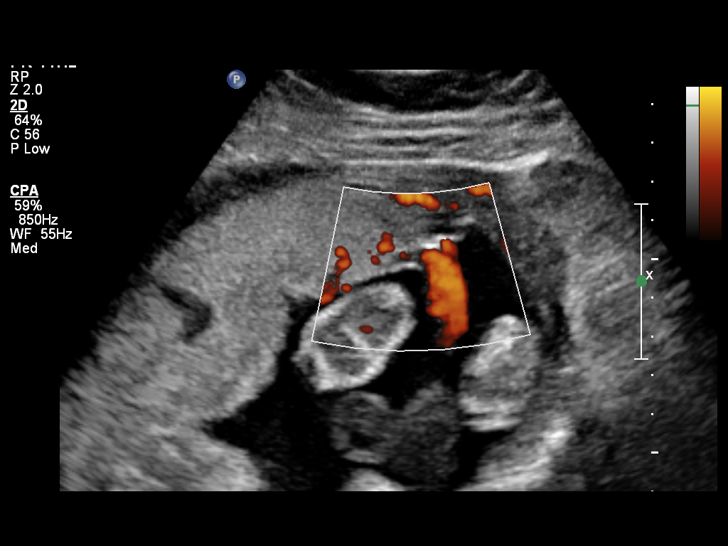
[im 45/55]
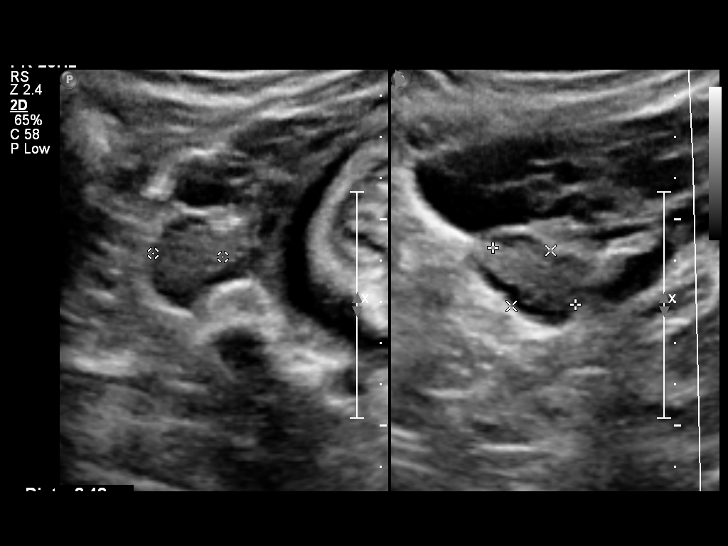
[im 49/55]
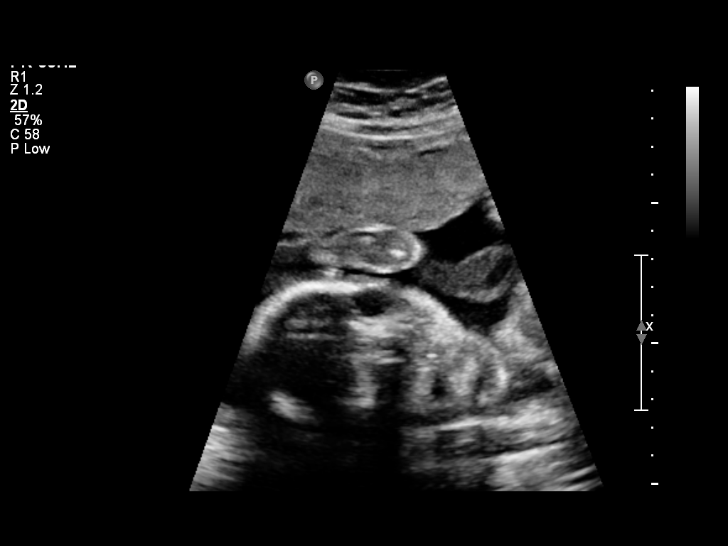
[im 53/55]
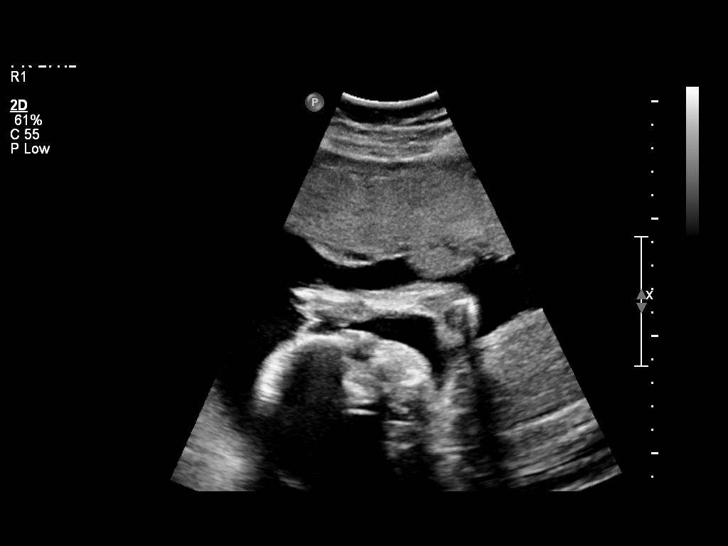

[12 of 28 positions shown; findings below may reference images not displayed]

FINDINGS: 1. Single intrauterine pregnancy.
 2. Estimated fetal weight is in the 58th%.
 3. Anterior placenta without evidence of previa.
 4. Normal amniotic fluid volume.
 5. Normal transabdominal cervical length.
 6. The views of the profile and heart remain limited.
 7. The remainder of the limited anatomy survey is normal.
Recommendations

 1. Appropriate fetal growth.
 2. Limited anatomy survey:
 - recommend follow up in 3-4 weeks to complete anatomic
 survey

 questions or concerns.

## 2015-11-25 IMAGING — US US OB FOLLOW-UP
1 series · 12 of 28 positions shown · non-contrast
Comparison: none

[Series 1: us ob follow up · 68 acquisitions, 12 frames shown]
[im 3/68]
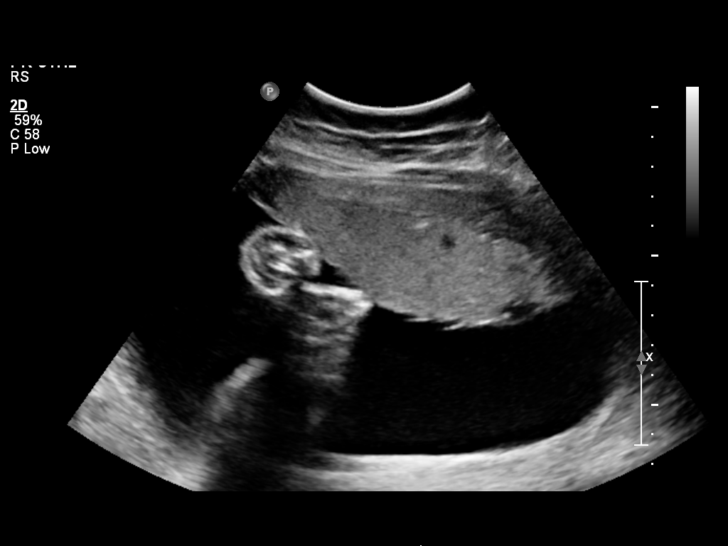
[im 8/68]
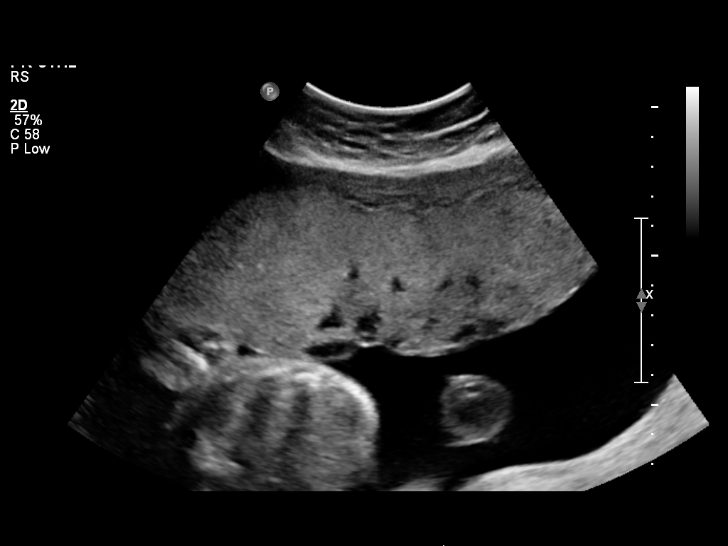
[im 13/68]
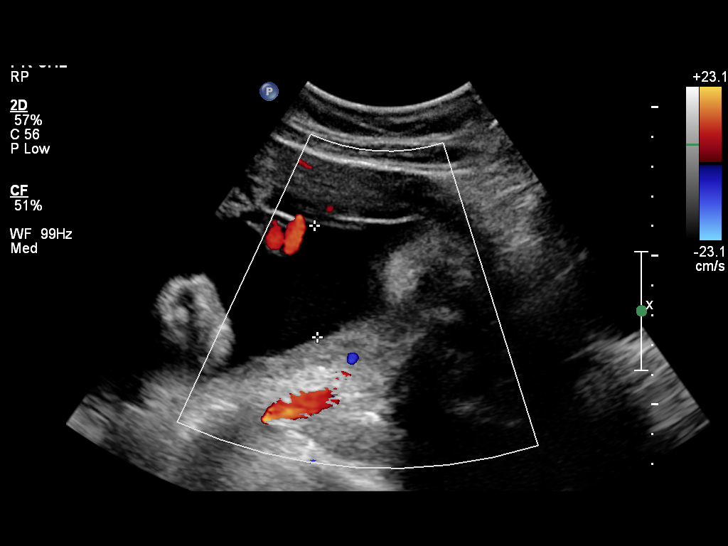
[im 20/68]
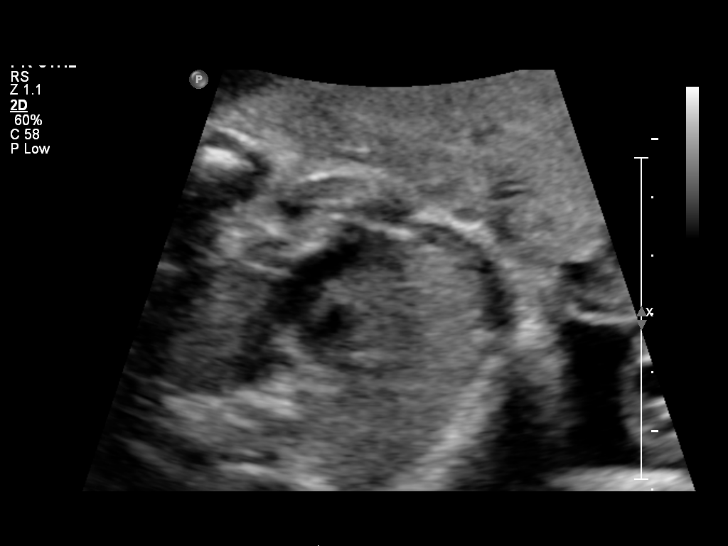
[im 25/68]
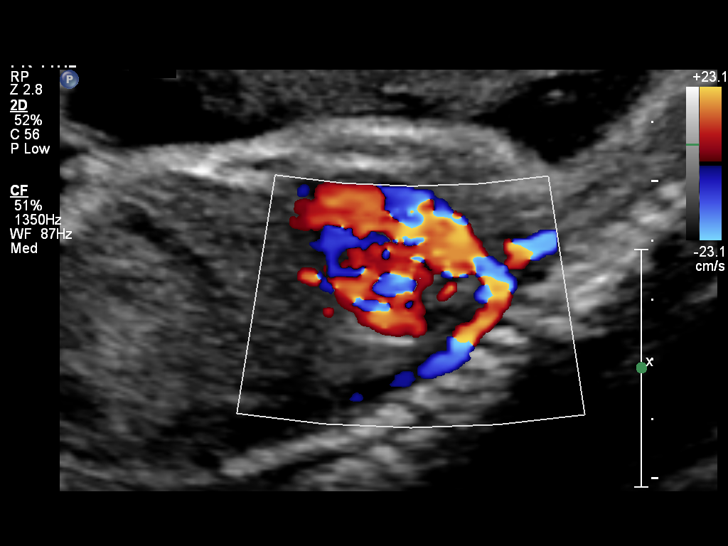
[im 30/68]
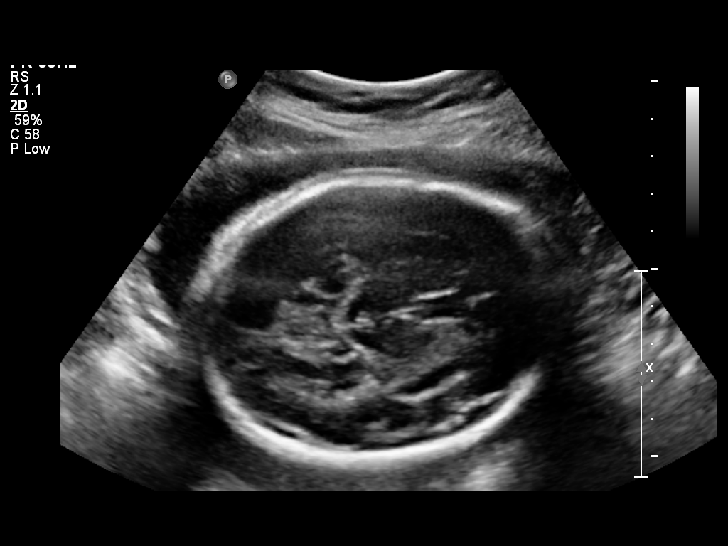
[im 38/68]
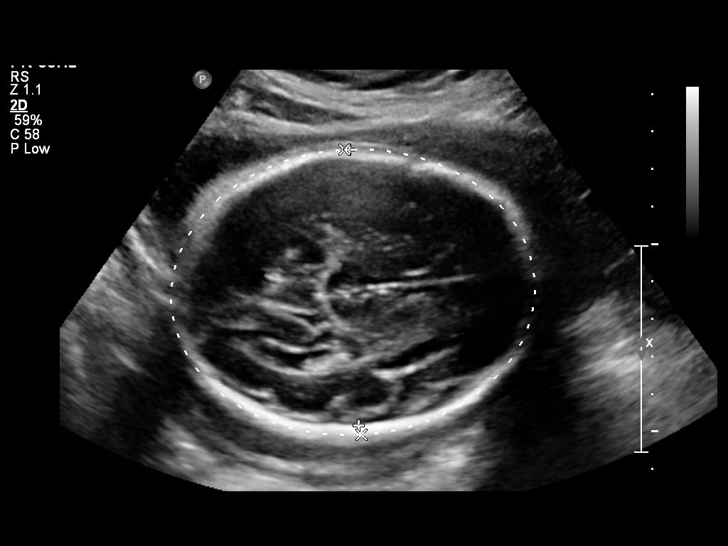
[im 43/68]
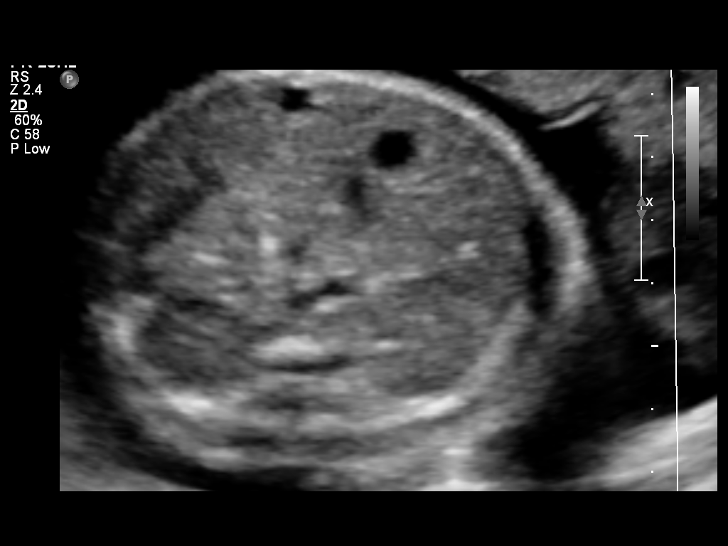
[im 48/68]
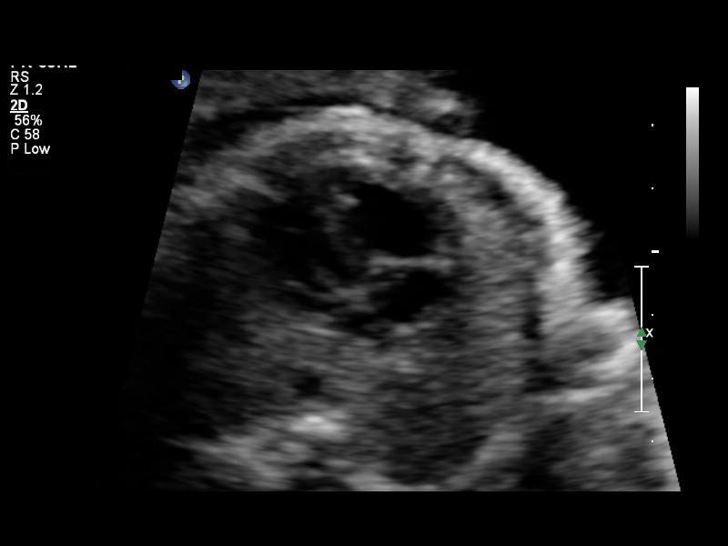
[im 55/68]
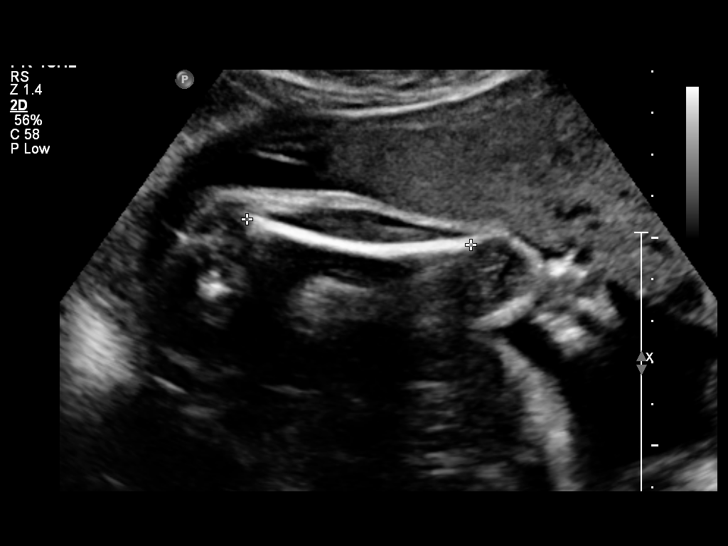
[im 60/68]
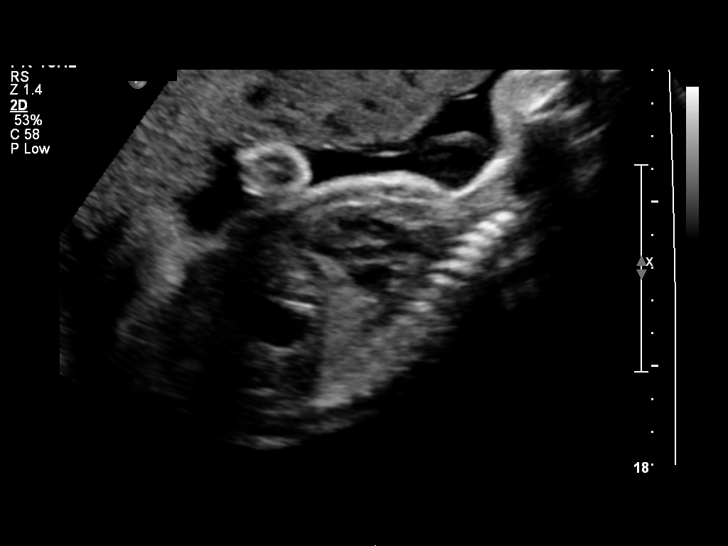
[im 65/68]
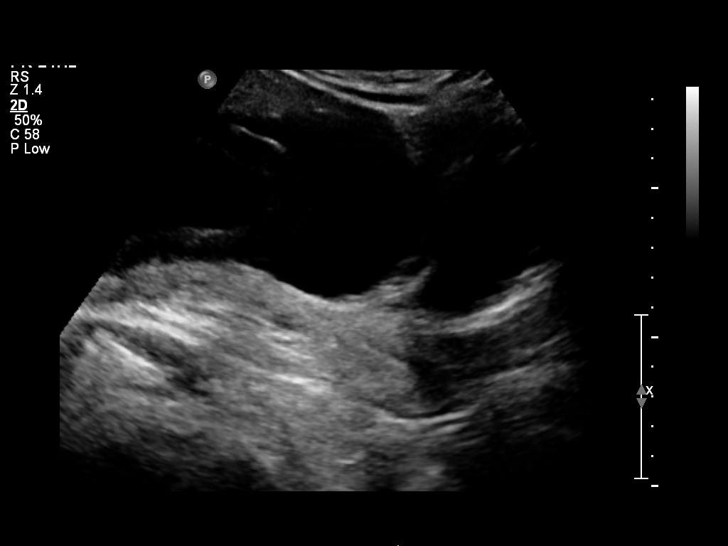

[12 of 28 positions shown; findings below may reference images not displayed]

OBSTETRICS REPORT
                      (Signed Final 04/17/2014 [DATE])

Service(s) Provided

 US OB FOLLOW UP                                       76816.1
Indications

 Follow-up incomplete fetal anatomic evaluation        Z36
 28 weeks gestation of pregnancy
Fetal Evaluation

 Num Of Fetuses:    1
 Fetal Heart Rate:  143                          bpm
 Cardiac Activity:  Observed
 Presentation:      Cephalic
 Placenta:          Anterior, above cervical os
 P. Cord            Previously Visualized
 Insertion:

 Amniotic Fluid
 AFI FV:      Subjectively increased
 AFI Sum:     21.5    cm       87  %Tile     Larg Pckt:    8.02  cm
 RUQ:   8.02    cm   RLQ:    4.67   cm    LUQ:   5.07    cm   LLQ:    3.74   cm
Biometry

 BPD:     73.1  mm     G. Age:  29w 2d                CI:        72.35   70 - 86
                                                      FL/HC:      19.7   18.8 -

 HC:     273.4  mm     G. Age:  29w 6d       62  %    HC/AC:      1.12   1.05 -

 AC:     244.1  mm     G. Age:  28w 5d       50  %    FL/BPD:     73.6   71 - 87
 FL:      53.8  mm     G. Age:  28w 4d       38  %    FL/AC:      22.0   20 - 24
 HUM:     50.2  mm     G. Age:  29w 3d       65  %

 Est. FW:    1101  gm    2 lb 13 oz      57  %
Gestational Age

 U/S Today:     29w 1d                                        EDD:   07/02/14
 Best:          28w 3d     Det. By:  Early Ultrasound         EDD:   07/07/14
Anatomy

 Cranium:          Appears normal         Aortic Arch:      Appears normal
 Fetal Cavum:      Appears normal         Ductal Arch:      Appears normal
 Ventricles:       Appears normal         Diaphragm:        Appears normal
 Choroid Plexus:   Appears normal         Stomach:          Appears normal, left
                                                            sided
 Cerebellum:       Appears normal         Abdomen:          Appears normal
 Posterior Fossa:  Appears normal         Abdominal Wall:   Previously seen
 Nuchal Fold:      Not applicable (>20    Cord Vessels:     Previously seen
                   wks GA)
 Face:             Orbits and profile     Kidneys:          Appear normal
                   previously seen
 Lips:             Previously seen        Bladder:          Appears normal
 Heart:            Appears normal         Spine:            Previously seen
                   (4CH, axis, and
                   situs)
 RVOT:             Appears normal         Lower             Previously seen
                                          Extremities:
 LVOT:             Appears normal         Upper             Previously seen
                                          Extremities:

 Other:  Male gender. Technically difficult due to fetal position.
Cervix Uterus Adnexa

 Cervical Length:    3.52     cm

 Cervix:       Normal appearance by transabdominal scan.
 Uterus:       No abnormality visualized.
 Left Ovary:    Not visualized.
 Right Ovary:   Not visualized.

 Adnexa:     No abnormality visualized.
Comments

 The patient's fetal anatomic survey is now complete.  No fetal
 anomalies or soft markers of aneuploidy were seen.  Ms.
 Wren amniotic fluid volume is subjectively increased with a
 maximum vertical pocket of 8 cm (suggesting polyhdramnios)
 but a normal AFI of 21.  A limited ultrasound should be
 performed in 2 weeks to reassess amniotic fluid volume.
Impression

 Single living intrauterine pregnancy at 28 weeks 3 days.
 Appropriate interval fetal growth (57%).
 Subjectively increased amniotic fluid volume (see comments).
 Normal fetal anatomy.
 No fetal anomalies or soft markers of aneuploidy seen.
Recommendations

 Recommend follow-up ultrasound examination in 2 weeks to
 reassess amniotic fluid volume.

 questions or concerns.
                Wachi, Muneki

## 2016-04-29 DIAGNOSIS — K8050 Calculus of bile duct without cholangitis or cholecystitis without obstruction: Secondary | ICD-10-CM | POA: Insufficient documentation

## 2017-07-31 ENCOUNTER — Ambulatory Visit: Payer: Managed Care, Other (non HMO) | Admitting: Obstetrics & Gynecology

## 2017-08-23 ENCOUNTER — Ambulatory Visit: Payer: Managed Care, Other (non HMO) | Admitting: Obstetrics & Gynecology

## 2017-09-10 ENCOUNTER — Encounter: Payer: Self-pay | Admitting: Obstetrics & Gynecology

## 2017-09-10 ENCOUNTER — Ambulatory Visit (INDEPENDENT_AMBULATORY_CARE_PROVIDER_SITE_OTHER): Payer: BLUE CROSS/BLUE SHIELD | Admitting: Obstetrics & Gynecology

## 2017-09-10 VITALS — BP 129/86 | HR 74 | Resp 16 | Ht 67.0 in | Wt 180.0 lb

## 2017-09-10 DIAGNOSIS — Z124 Encounter for screening for malignant neoplasm of cervix: Secondary | ICD-10-CM

## 2017-09-10 DIAGNOSIS — Z01419 Encounter for gynecological examination (general) (routine) without abnormal findings: Secondary | ICD-10-CM

## 2017-09-10 NOTE — Addendum Note (Signed)
Addended by: Asencion Islam on: 09/10/2017 04:32 PM   Modules accepted: Orders

## 2017-09-10 NOTE — Progress Notes (Signed)
Subjective:     Jennifer Cantrell is a 27 y.o. female here for a routine exam.  Current complaints: none.  Uses condoms   Gynecologic History Patient's last menstrual period was 08/30/2017. Contraception: condoms Last Pap: 2016. Results were: normal per patient.   Obstetric History OB History  Gravida Para Term Preterm AB Living  1 1 1     1   SAB TAB Ectopic Multiple Live Births        0 1    # Outcome Date GA Lbr Len/2nd Weight Sex Delivery Anes PTL Lv  1 Term 07/15/14 [redacted]w[redacted]d 09:47 / 02:52 10 lb 4.2 oz (4.655 kg) M Vag-Spont EPI  LIV     The following portions of the patient's history were reviewed and updated as appropriate: allergies, current medications, past family history, past medical history, past social history, past surgical history and problem list.  Review of Systems Pertinent items noted in HPI and remainder of comprehensive ROS otherwise negative.    Objective:   Vitals:   09/10/17 1602  BP: 129/86  Pulse: 74  Resp: 16  Weight: 180 lb (81.6 kg)  Height: 5\' 7"  (1.702 m)   Vitals:  WNL General appearance: alert, cooperative and no distress  HEENT: Normocephalic, without obvious abnormality, atraumatic Eyes: negative Throat: lips, mucosa, and tongue normal; teeth and gums normal  Respiratory: Clear to auscultation bilaterally  CV: Regular rate and rhythm  Breasts:  Normal appearance, no masses or tenderness, left inverted nipple (unchanged)  GI: Soft, non-tender; bowel sounds normal; no masses,  no organomegaly  GU: External Genitalia:  Tanner V, no lesion Urethra:  No prolapse   Vagina: Pink, normal rugae, no blood or discharge  Cervix: No CMT, no lesion  Uterus:  Normal size and contour, non tender  Adnexa: Normal, no masses, non tender  Musculoskeletal: No edema, redness or tenderness in the calves or thighs  Skin: No lesions or rash  Lymphatic: Axillary adenopathy: none     Psychiatric: Normal mood and behavior    Assessment:    Healthy female exam.     Plan:   Pap smear q 3 years Continue condoms Exercise encouraged.

## 2017-09-12 LAB — CYTOLOGY - PAP: DIAGNOSIS: NEGATIVE

## 2020-01-29 LAB — OB RESULTS CONSOLE GC/CHLAMYDIA
Chlamydia: NEGATIVE
Gonorrhea: NEGATIVE

## 2020-01-29 LAB — OB RESULTS CONSOLE ABO/RH: RH Type: POSITIVE

## 2020-01-29 LAB — OB RESULTS CONSOLE HEPATITIS B SURFACE ANTIGEN: Hepatitis B Surface Ag: NEGATIVE

## 2020-01-29 LAB — OB RESULTS CONSOLE HIV ANTIBODY (ROUTINE TESTING): HIV: NONREACTIVE

## 2020-01-29 LAB — OB RESULTS CONSOLE ANTIBODY SCREEN: Antibody Screen: NEGATIVE

## 2020-01-29 LAB — OB RESULTS CONSOLE RUBELLA ANTIBODY, IGM: Rubella: IMMUNE

## 2020-01-29 LAB — OB RESULTS CONSOLE RPR: RPR: NONREACTIVE

## 2020-06-05 ENCOUNTER — Encounter (HOSPITAL_COMMUNITY): Payer: Self-pay | Admitting: Obstetrics and Gynecology

## 2020-06-05 ENCOUNTER — Inpatient Hospital Stay (HOSPITAL_COMMUNITY)
Admission: AD | Admit: 2020-06-05 | Discharge: 2020-06-05 | Disposition: A | Discharge status: Home / Self Care | Payer: 59 | Attending: Obstetrics and Gynecology | Admitting: Obstetrics and Gynecology

## 2020-06-05 ENCOUNTER — Other Ambulatory Visit: Payer: Self-pay

## 2020-06-05 DIAGNOSIS — Z3A29 29 weeks gestation of pregnancy: Secondary | ICD-10-CM | POA: Insufficient documentation

## 2020-06-05 DIAGNOSIS — R030 Elevated blood-pressure reading, without diagnosis of hypertension: Secondary | ICD-10-CM

## 2020-06-05 DIAGNOSIS — O368130 Decreased fetal movements, third trimester, not applicable or unspecified: Secondary | ICD-10-CM | POA: Diagnosis not present

## 2020-06-05 DIAGNOSIS — O10913 Unspecified pre-existing hypertension complicating pregnancy, third trimester: Secondary | ICD-10-CM | POA: Insufficient documentation

## 2020-06-05 DIAGNOSIS — O26893 Other specified pregnancy related conditions, third trimester: Secondary | ICD-10-CM | POA: Insufficient documentation

## 2020-06-05 DIAGNOSIS — N898 Other specified noninflammatory disorders of vagina: Secondary | ICD-10-CM | POA: Insufficient documentation

## 2020-06-05 DIAGNOSIS — N858 Other specified noninflammatory disorders of uterus: Secondary | ICD-10-CM

## 2020-06-05 DIAGNOSIS — I10 Essential (primary) hypertension: Secondary | ICD-10-CM

## 2020-06-05 DIAGNOSIS — Z79899 Other long term (current) drug therapy: Secondary | ICD-10-CM | POA: Insufficient documentation

## 2020-06-05 HISTORY — DX: Gestational diabetes mellitus in pregnancy, unspecified control: O24.419

## 2020-06-05 HISTORY — DX: Benign neoplasm of connective and other soft tissue, unspecified: D21.9

## 2020-06-05 HISTORY — DX: Anxiety disorder, unspecified: F41.9

## 2020-06-05 HISTORY — DX: Headache, unspecified: R51.9

## 2020-06-05 LAB — COMPREHENSIVE METABOLIC PANEL
ALT: 19 U/L (ref 0–44)
AST: 22 U/L (ref 15–41)
Albumin: 3.1 g/dL — ABNORMAL LOW (ref 3.5–5.0)
Alkaline Phosphatase: 63 U/L (ref 38–126)
Anion gap: 10 (ref 5–15)
BUN: 6 mg/dL (ref 6–20)
CO2: 21 mmol/L — ABNORMAL LOW (ref 22–32)
Calcium: 9.3 mg/dL (ref 8.9–10.3)
Chloride: 104 mmol/L (ref 98–111)
Creatinine, Ser: 0.68 mg/dL (ref 0.44–1.00)
GFR, Estimated: >60 mL/min (ref >60)
Glucose, Bld: 132 mg/dL — ABNORMAL HIGH (ref 70–99)
Potassium: 4.2 mmol/L (ref 3.5–5.1)
Sodium: 135 mmol/L (ref 135–145)
Total Bilirubin: 0.5 mg/dL (ref 0.3–1.2)
Total Protein: 6.3 g/dL — ABNORMAL LOW (ref 6.5–8.1)

## 2020-06-05 LAB — URINALYSIS, ROUTINE W REFLEX MICROSCOPIC
Bilirubin Urine: NEGATIVE
Glucose, UA: 50 mg/dL — AB
Hgb urine dipstick: NEGATIVE
Ketones, ur: NEGATIVE mg/dL
Leukocytes,Ua: NEGATIVE
Nitrite: NEGATIVE
Protein, ur: NEGATIVE mg/dL
Specific Gravity, Urine: 1.005 (ref 1.005–1.030)
pH: 6 (ref 5.0–8.0)

## 2020-06-05 LAB — CBC
HCT: 34.8 % — ABNORMAL LOW (ref 36.0–46.0)
Hemoglobin: 11.8 g/dL — ABNORMAL LOW (ref 12.0–15.0)
MCH: 32.6 pg (ref 26.0–34.0)
MCHC: 33.9 g/dL (ref 30.0–36.0)
MCV: 96.1 fL (ref 80.0–100.0)
Platelets: 169 10*3/uL (ref 150–400)
RBC: 3.62 MIL/uL — ABNORMAL LOW (ref 3.87–5.11)
RDW: 13.1 % (ref 11.5–15.5)
WBC: 9.5 10*3/uL (ref 4.0–10.5)
nRBC: 0 % (ref 0.0–0.2)

## 2020-06-05 LAB — AMNISURE RUPTURE OF MEMBRANE (ROM) NOT AT ARMC: Amnisure ROM: NEGATIVE

## 2020-06-05 LAB — POCT FERN TEST: POCT Fern Test: NEGATIVE

## 2020-06-05 LAB — PROTEIN / CREATININE RATIO, URINE
Creatinine, Urine: 27.69 mg/dL
Total Protein, Urine: <6 mg/dL

## 2020-06-05 LAB — FETAL FIBRONECTIN: Fetal Fibronectin: NEGATIVE

## 2020-06-05 NOTE — Discharge Instructions (Signed)
Third Trimester of Pregnancy The third trimester is from week 28 through week 40 (months 7 through 9). The third trimester is a time when the unborn baby (fetus) is growing rapidly. At the end of the ninth month, the fetus is about 20 inches in length and weighs 6-10 pounds. Body changes during your third trimester Your body will continue to go through many changes during pregnancy. The changes vary from woman to woman. During the third trimester:  Your weight will continue to increase. You can expect to gain 25-35 pounds (11-16 kg) by the end of the pregnancy.  You may begin to get stretch marks on your hips, abdomen, and breasts.  You may urinate more often because the fetus is moving lower into your pelvis and pressing on your bladder.  You may develop or continue to have heartburn. This is caused by increased hormones that slow down muscles in the digestive tract.  You may develop or continue to have constipation because increased hormones slow digestion and cause the muscles that push waste through your intestines to relax.  You may develop hemorrhoids. These are swollen veins (varicose veins) in the rectum that can itch or be painful.  You may develop swollen, bulging veins (varicose veins) in your legs.  You may have increased body aches in the pelvis, back, or thighs. This is due to weight gain and increased hormones that are relaxing your joints.  You may have changes in your hair. These can include thickening of your hair, rapid growth, and changes in texture. Some women also have hair loss during or after pregnancy, or hair that feels dry or thin. Your hair will most likely return to normal after your baby is born.  Your breasts will continue to grow and they will continue to become tender. A yellow fluid (colostrum) may leak from your breasts. This is the first milk you are producing for your baby.  Your belly button may stick out.  You may notice more swelling in your hands,  face, or ankles.  You may have increased tingling or numbness in your hands, arms, and legs. The skin on your belly may also feel numb.  You may feel short of breath because of your expanding uterus.  You may have more problems sleeping. This can be caused by the size of your belly, increased need to urinate, and an increase in your body's metabolism.  You may notice the fetus "dropping," or moving lower in your abdomen (lightening).  You may have increased vaginal discharge.  You may notice your joints feel loose and you may have pain around your pelvic bone. What to expect at prenatal visits You will have prenatal exams every 2 weeks until week 36. Then you will have weekly prenatal exams. During a routine prenatal visit:  You will be weighed to make sure you and the baby are growing normally.  Your blood pressure will be taken.  Your abdomen will be measured to track your baby's growth.  The fetal heartbeat will be listened to.  Any test results from the previous visit will be discussed.  You may have a cervical check near your due date to see if your cervix has softened or thinned (effaced).  You will be tested for Group B streptococcus. This happens between 35 and 37 weeks. Your health care provider may ask you:  What your birth plan is.  How you are feeling.  If you are feeling the baby move.  If you have had any abnormal   symptoms, such as leaking fluid, bleeding, severe headaches, or abdominal cramping.  If you are using any tobacco products, including cigarettes, chewing tobacco, and electronic cigarettes.  If you have any questions. Other tests or screenings that may be performed during your third trimester include:  Blood tests that check for low iron levels (anemia).  Fetal testing to check the health, activity level, and growth of the fetus. Testing is done if you have certain medical conditions or if there are problems during the pregnancy.  Nonstress test  (NST). This test checks the health of your baby to make sure there are no signs of problems, such as the baby not getting enough oxygen. During this test, a belt is placed around your belly. The baby is made to move, and its heart rate is monitored during movement. What is false labor? False labor is a condition in which you feel small, irregular tightenings of the muscles in the womb (contractions) that usually go away with rest, changing position, or drinking water. These are called Braxton Hicks contractions. Contractions may last for hours, days, or even weeks before true labor sets in. If contractions come at regular intervals, become more frequent, increase in intensity, or become painful, you should see your health care provider. What are the signs of labor?  Abdominal cramps.  Regular contractions that start at 10 minutes apart and become stronger and more frequent with time.  Contractions that start on the top of the uterus and spread down to the lower abdomen and back.  Increased pelvic pressure and dull back pain.  A watery or bloody mucus discharge that comes from the vagina.  Leaking of amniotic fluid. This is also known as your "water breaking." It could be a slow trickle or a gush. Let your health care provider know if it has a color or strange odor. If you have any of these signs, call your health care provider right away, even if it is before your due date. Follow these instructions at home: Medicines  Follow your health care provider's instructions regarding medicine use. Specific medicines may be either safe or unsafe to take during pregnancy.  Take a prenatal vitamin that contains at least 600 micrograms (mcg) of folic acid.  If you develop constipation, try taking a stool softener if your health care provider approves. Eating and drinking   Eat a balanced diet that includes fresh fruits and vegetables, whole grains, good sources of protein such as meat, eggs, or tofu,  and low-fat dairy. Your health care provider will help you determine the amount of weight gain that is right for you.  Avoid raw meat and uncooked cheese. These carry germs that can cause birth defects in the baby.  If you have low calcium intake from food, talk to your health care provider about whether you should take a daily calcium supplement.  Eat four or five small meals rather than three large meals a day.  Limit foods that are high in fat and processed sugars, such as fried and sweet foods.  To prevent constipation: ? Drink enough fluid to keep your urine clear or pale yellow. ? Eat foods that are high in fiber, such as fresh fruits and vegetables, whole grains, and beans. Activity  Exercise only as directed by your health care provider. Most women can continue their usual exercise routine during pregnancy. Try to exercise for 30 minutes at least 5 days a week. Stop exercising if you experience uterine contractions.  Avoid heavy lifting.  Do   not exercise in extreme heat or humidity, or at high altitudes.  Wear low-heel, comfortable shoes.  Practice good posture.  You may continue to have sex unless your health care provider tells you otherwise. Relieving pain and discomfort  Take frequent breaks and rest with your legs elevated if you have leg cramps or low back pain.  Take warm sitz baths to soothe any pain or discomfort caused by hemorrhoids. Use hemorrhoid cream if your health care provider approves.  Wear a good support bra to prevent discomfort from breast tenderness.  If you develop varicose veins: ? Wear support pantyhose or compression stockings as told by your healthcare provider. ? Elevate your feet for 15 minutes, 3-4 times a day. Prenatal care  Write down your questions. Take them to your prenatal visits.  Keep all your prenatal visits as told by your health care provider. This is important. Safety  Wear your seat belt at all times when driving.  Make  a list of emergency phone numbers, including numbers for family, friends, the hospital, and police and fire departments. General instructions  Avoid cat litter boxes and soil used by cats. These carry germs that can cause birth defects in the baby. If you have a cat, ask someone to clean the litter box for you.  Do not travel far distances unless it is absolutely necessary and only with the approval of your health care provider.  Do not use hot tubs, steam rooms, or saunas.  Do not drink alcohol.  Do not use any products that contain nicotine or tobacco, such as cigarettes and e-cigarettes. If you need help quitting, ask your health care provider.  Do not use any medicinal herbs or unprescribed drugs. These chemicals affect the formation and growth of the baby.  Do not douche or use tampons or scented sanitary pads.  Do not cross your legs for long periods of time.  To prepare for the arrival of your baby: ? Take prenatal classes to understand, practice, and ask questions about labor and delivery. ? Make a trial run to the hospital. ? Visit the hospital and tour the maternity area. ? Arrange for maternity or paternity leave through employers. ? Arrange for family and friends to take care of pets while you are in the hospital. ? Purchase a rear-facing car seat and make sure you know how to install it in your car. ? Pack your hospital bag. ? Prepare the baby's nursery. Make sure to remove all pillows and stuffed animals from the baby's crib to prevent suffocation.  Visit your dentist if you have not gone during your pregnancy. Use a soft toothbrush to brush your teeth and be gentle when you floss. Contact a health care provider if:  You are unsure if you are in labor or if your water has broken.  You become dizzy.  You have mild pelvic cramps, pelvic pressure, or nagging pain in your abdominal area.  You have lower back pain.  You have persistent nausea, vomiting, or  diarrhea.  You have an unusual or bad smelling vaginal discharge.  You have pain when you urinate. Get help right away if:  Your water breaks before 37 weeks.  You have regular contractions less than 5 minutes apart before 37 weeks.  You have a fever.  You are leaking fluid from your vagina.  You have spotting or bleeding from your vagina.  You have severe abdominal pain or cramping.  You have rapid weight loss or weight gain.  You have   shortness of breath with chest pain.  You notice sudden or extreme swelling of your face, hands, ankles, feet, or legs.  Your baby makes fewer than 10 movements in 2 hours.  You have severe headaches that do not go away when you take medicine.  You have vision changes. Summary  The third trimester is from week 28 through week 40, months 7 through 9. The third trimester is a time when the unborn baby (fetus) is growing rapidly.  During the third trimester, your discomfort may increase as you and your baby continue to gain weight. You may have abdominal, leg, and back pain, sleeping problems, and an increased need to urinate.  During the third trimester your breasts will keep growing and they will continue to become tender. A yellow fluid (colostrum) may leak from your breasts. This is the first milk you are producing for your baby.  False labor is a condition in which you feel small, irregular tightenings of the muscles in the womb (contractions) that eventually go away. These are called Braxton Hicks contractions. Contractions may last for hours, days, or even weeks before true labor sets in.  Signs of labor can include: abdominal cramps; regular contractions that start at 10 minutes apart and become stronger and more frequent with time; watery or bloody mucus discharge that comes from the vagina; increased pelvic pressure and dull back pain; and leaking of amniotic fluid. This information is not intended to replace advice given to you by your  health care provider. Make sure you discuss any questions you have with your health care provider. Document Revised: 09/19/2018 Document Reviewed: 07/04/2016 Elsevier Patient Education  2020 [employer]  

## 2020-06-05 NOTE — MAU Note (Signed)
Noticed questionable leaking yesterday, clear fluid. No bleeding. No real pain.

## 2020-06-05 NOTE — MAU Provider Note (Signed)
Chief Complaint:  Rupture of Membranes   Event Date/Time   First Provider Initiated Contact with Patient 06/05/20 1153     HPI: Jennifer Cantrell is a 29 y.o. G2P1001 at 32w4dwho presents to maternity admissions reporting leaking fluid since yesterday, decreased fetal movement.  No bleeding or pain. She reports good fetal movement, denies vaginal itching/burning, urinary symptoms, h/a, dizziness, n/v, diarrhea, constipation or fever/chills.  She denies headache, visual changes or RUQ abdominal pain.  Vaginal Discharge The patient's primary symptoms include vaginal discharge. The patient's pertinent negatives include no genital itching, genital lesions, genital odor, pelvic pain or vaginal bleeding. This is a new problem. The current episode started yesterday. The problem occurs intermittently. The problem has been unchanged. The patient is experiencing no pain. Pertinent negatives include no abdominal pain, chills, constipation, diarrhea, dysuria, fever, nausea or vomiting. The vaginal discharge was clear and watery. There has been no bleeding. She has not been passing clots. She has not been passing tissue. Nothing aggravates the symptoms. She has tried nothing for the symptoms.    RN Note: Noticed questionable leaking yesterday, clear fluid. No bleeding. No real pain  Past Medical History: Past Medical History:  Diagnosis Date  . Anxiety   . Fibroid   . Gestational diabetes    faileld 1 hr, did not do 3 hr, doing testing  . Headache     Past obstetric history: OB History  Gravida Para Term Preterm AB Living  2 1 1     1   SAB IAB Ectopic Multiple Live Births        0 1    # Outcome Date GA Lbr Len/2nd Weight Sex Delivery Anes PTL Lv  2 Current           1 Term 07/15/14 [redacted]w[redacted]d 09:47 / 02:52 4655 g M Vag-Spont EPI  LIV    Past Surgical History: Past Surgical History:  Procedure Laterality Date  . KIDNEY SURGERY  1994  . LAPAROSCOPIC CHOLECYSTECTOMY  2017  . TONSILLECTOMY AND  ADENOIDECTOMY      Family History: Family History  Problem Relation Age of Onset  . Hypertension Mother   . Cancer Maternal Grandfather        bladder  . Diabetes Maternal Grandfather   . Cancer Maternal Grandmother        uterine    Social History: Social History   Tobacco Use  . Smoking status: Never Smoker  . Smokeless tobacco: Never Used  Vaping Use  . Vaping Use: Never used  Substance Use Topics  . Alcohol use: No  . Drug use: No    Allergies: No Known Allergies  Meds:  Medications Prior to Admission  Medication Sig Dispense Refill Last Dose  . omega-3 acid ethyl esters (LOVAZA) 1 g capsule Take by mouth 2 (two) times daily.   06/04/2020 at Unknown time  . omeprazole (PRILOSEC) 40 MG capsule   1 06/04/2020 at Unknown time  . Prenatal Multivit-Min-Fe-FA (PRENATAL VITAMINS PO) Take by mouth daily.   06/05/2020 at Unknown time  . SUMAtriptan (IMITREX) 100 MG tablet Take 1 tablet (100 mg total) by mouth once as needed for migraine. May repeat in 2 hours if headache persists or recurs. 9 tablet 11   . SUMAtriptan (IMITREX) 100 MG tablet Take 100 mg by mouth every 2 (two) hours as needed for migraine. May repeat in 2 hours if headache persists or recurs.       I have reviewed patient's Past Medical Hx, Surgical Hx, Family  Hx, Social Hx, medications and allergies.   ROS:  Review of Systems  Constitutional: Negative for chills and fever.  Gastrointestinal: Negative for abdominal pain, constipation, diarrhea, nausea and vomiting.  Genitourinary: Positive for vaginal discharge. Negative for dysuria and pelvic pain.   Other systems negative  Physical Exam   Patient Vitals for the past 24 hrs:  BP Temp Temp src Pulse Resp SpO2 Height Weight  06/05/20 1143 (!) 142/79 97.8 F (36.6 C) Oral (!) 106 18 -- -- --  06/05/20 1140 -- -- -- -- -- 99 % -- --  06/05/20 1126 -- -- -- -- -- -- 5\' 7"  (1.702 m) 94.7 kg   Vitals:   06/05/20 1155 06/05/20 1201 06/05/20 1216 06/05/20  1231  BP:  127/73 130/67 130/72  Pulse:  95 96 96  Resp:      Temp:      TempSrc:      SpO2: 100%     Weight:      Height:        Constitutional: Well-developed, well-nourished female in no acute distress.  Cardiovascular: normal rate and rhythm Respiratory: normal effort, clear to auscultation bilaterally GI: Abd soft, non-tender, gravid appropriate for gestational age.   No rebound or guarding. MS: Extremities nontender, no edema, normal ROM Neurologic: Alert and oriented x 4.  GU: Neg CVAT.  PELVIC EXAM: Cervix pink, visually closed, without lesion, scant white creamy discharge, vaginal walls and external genitalia normal  Cervix long, closed, high  FHT:  Baseline 145 , moderate variability, small accelerations present, no decelerations Contractions: Occasional irritability   Labs: Results for orders placed or performed during the hospital encounter of 06/05/20 (from the past 24 hour(s))  Fern Test     Status: None   Collection Time: 06/05/20 11:55 AM  Result Value Ref Range   POCT Fern Test Negative = intact amniotic membranes   CBC     Status: Abnormal   Collection Time: 06/05/20 12:10 PM  Result Value Ref Range   WBC 9.5 4.0 - 10.5 K/uL   RBC 3.62 (L) 3.87 - 5.11 MIL/uL   Hemoglobin 11.8 (L) 12.0 - 15.0 g/dL   HCT 34.8 (L) 36.0 - 46.0 %   MCV 96.1 80.0 - 100.0 fL   MCH 32.6 26.0 - 34.0 pg   MCHC 33.9 30.0 - 36.0 g/dL   RDW 13.1 11.5 - 15.5 %   Platelets 169 150 - 400 K/uL   nRBC 0.0 0.0 - 0.2 %  Comprehensive metabolic panel     Status: Abnormal   Collection Time: 06/05/20 12:10 PM  Result Value Ref Range   Sodium 135 135 - 145 mmol/L   Potassium 4.2 3.5 - 5.1 mmol/L   Chloride 104 98 - 111 mmol/L   CO2 21 (L) 22 - 32 mmol/L   Glucose, Bld 132 (H) 70 - 99 mg/dL   BUN 6 6 - 20 mg/dL   Creatinine, Ser 0.68 0.44 - 1.00 mg/dL   Calcium 9.3 8.9 - 10.3 mg/dL   Total Protein 6.3 (L) 6.5 - 8.1 g/dL   Albumin 3.1 (L) 3.5 - 5.0 g/dL   AST 22 15 - 41 U/L   ALT 19  0 - 44 U/L   Alkaline Phosphatase 63 38 - 126 U/L   Total Bilirubin 0.5 0.3 - 1.2 mg/dL   GFR, Estimated >60 >60 mL/min   Anion gap 10 5 - 15  Amnisure rupture of membrane (rom)not at [facility]     Status: None  Collection Time: 06/05/20 12:30 PM  Result Value Ref Range   Amnisure ROM NEGATIVE   Urinalysis, Routine w reflex microscopic Urine, Clean Catch     Status: Abnormal   Collection Time: 06/05/20 12:31 PM  Result Value Ref Range   Color, Urine STRAW (A) YELLOW   APPearance HAZY (A) CLEAR   Specific Gravity, Urine 1.005 1.005 - 1.030   pH 6.0 5.0 - 8.0   Glucose, UA 50 (A) NEGATIVE mg/dL   Hgb urine dipstick NEGATIVE NEGATIVE   Bilirubin Urine NEGATIVE NEGATIVE   Ketones, ur NEGATIVE NEGATIVE mg/dL   Protein, ur NEGATIVE NEGATIVE mg/dL   Nitrite NEGATIVE NEGATIVE   Leukocytes,Ua NEGATIVE NEGATIVE  Fetal fibronectin     Status: None   Collection Time: 06/05/20 12:31 PM  Result Value Ref Range   Fetal Fibronectin NEGATIVE NEGATIVE  Protein / creatinine ratio, urine     Status: None   Collection Time: 06/05/20 12:32 PM  Result Value Ref Range   Creatinine, Urine 27.69 mg/dL   Total Protein, Urine <6 mg/dL   Protein Creatinine Ratio        0.00 - 0.15 mg/mg[Cre]       Imaging:  No results found.  MAU Course/MDM: I have ordered Preeclampsia labs, fetal fibronectin and Amnisure.  These are all normal.  Amnisure and FFn are negative NST reviewed, reassuring for gestational age.    Treatments in MAU included EFM.    Assessment: Single IUP at [redacted]w[redacted]d Vaginal discharge in pregnancy Uterine irritability Isolated systolic Hypertension  Plan: Discharge home Preterm Labor precautions and fetal kick counts GHTN precautions Follow up in Office for prenatal visits   Encouraged to return if she develops worsening of symptoms, increase in pain, fever, or other concerning symptoms.   Pt stable at time of discharge.  Hansel Feinstein CNM, MSN Certified  Nurse-Midwife 06/05/2020 11:53 AM

## 2020-06-12 NOTE — L&D Delivery Note (Signed)
Delivery Note At 5:04 PM a viable and healthy female was delivered via Vaginal, Spontaneous (Presentation: Occiput Anterior), no difficulty  APGAR: 8, 9; weight pending .   Placenta status: Spontaneous, Intact.  Cord: 3 vessels with the following complications: true Knot and loose nuchal   Cord pH NA   Anesthesia: Epidural Episiotomy: None Lacerations: 2nd degree Suture Repair: 3.0 vicryl rapide Est. Blood Loss (mL):  450 cc   Mom to postpartum.  Baby to Couplet care / Skin to Skin. Baby seen by Resp therapist for grunting, retracting, got RT  Elveria Royals 08/19/2020, 5:55 PM

## 2020-08-11 ENCOUNTER — Telehealth (HOSPITAL_COMMUNITY): Payer: Self-pay | Admitting: *Deleted

## 2020-08-11 NOTE — Telephone Encounter (Signed)
Preadmission screen  

## 2020-08-12 ENCOUNTER — Encounter (HOSPITAL_COMMUNITY): Payer: Self-pay | Admitting: *Deleted

## 2020-08-12 ENCOUNTER — Telehealth (HOSPITAL_COMMUNITY): Payer: Self-pay | Admitting: *Deleted

## 2020-08-12 NOTE — Telephone Encounter (Signed)
Preadmission screen  

## 2020-08-13 ENCOUNTER — Other Ambulatory Visit: Payer: Self-pay | Admitting: Obstetrics & Gynecology

## 2020-08-17 ENCOUNTER — Other Ambulatory Visit (HOSPITAL_COMMUNITY)
Admission: RE | Admit: 2020-08-17 | Discharge: 2020-08-17 | Disposition: A | Discharge status: Home / Self Care | Payer: 59 | Source: Ambulatory Visit | Attending: Obstetrics & Gynecology | Admitting: Obstetrics & Gynecology

## 2020-08-17 DIAGNOSIS — Z01812 Encounter for preprocedural laboratory examination: Secondary | ICD-10-CM | POA: Insufficient documentation

## 2020-08-17 DIAGNOSIS — Z20822 Contact with and (suspected) exposure to covid-19: Secondary | ICD-10-CM | POA: Insufficient documentation

## 2020-08-17 LAB — SARS CORONAVIRUS 2 (TAT 6-24 HRS): SARS Coronavirus 2: NEGATIVE

## 2020-08-18 NOTE — H&P (Signed)
Jennifer Cantrell is a 30 y.o. female presenting for IOL. G2P1001, 39 wks IOL for macrosomia. A1GDM, well controlled, EFW 9 lbs Prior 10'4" SVD at 41 wks with shoulder dystocia history, no nerve damage or fracture.   Good PNcare, labs nl. A1GDM- Failed 1 hr Glucola so started CBG instead of 3hr GTT due to  LGA baby. Only 10-11 lbs weight gain in this pregnancy 36 wk sono - LGA at 7'5" 96%, AC 99%, BPD 77% HC 86% AFI 17 cm, Vx-   OB History     Gravida  2   Para  1   Term  1   Preterm      AB      Living  1      SAB      IAB      Ectopic      Multiple  0   Live Births  1          Past Medical History:  Diagnosis Date   Anxiety    Fibroid    Gestational diabetes    faileld 1 hr, did not do 3 hr, doing testing   Headache    Past Surgical History:  Procedure Laterality Date   KIDNEY SURGERY  1994   LAPAROSCOPIC CHOLECYSTECTOMY  2017   TONSILLECTOMY AND ADENOIDECTOMY     Family History: family history includes Cancer in her maternal grandfather and maternal grandmother; Diabetes in her maternal grandfather. Social History:  reports that she has never smoked. She has never used smokeless tobacco. She reports that she does not drink alcohol and does not use drugs.     Maternal Diabetes: Yes:  Diabetes Type:  Diet controlled Genetic Screening: Normal Maternal Ultrasounds/Referrals: Normal Fetal Ultrasounds or other Referrals:  None Maternal Substance Abuse:  No  Significant Maternal Medications:  Meds include: Other: Omeprazole  Significant Maternal Lab Results:  Group B Strep negative Other Comments:  None  Review of Systems History   There were no vitals taken for this visit. Exam Physical Exam  Physical exam:  BP 130/82   Pulse 73   Temp 98.3 F (36.8 C) (Oral)   Resp 16   Ht 5\' 7"  (1.702 m)   Wt 206 lb 14.4 oz (93.8 kg)   BMI 32.41 kg/m   A&O x 3, no acute distress. Pleasant HEENT neg, no thyromegaly Lungs CTA bilat CV RRR, A1S2 normal Abdo  soft, non tender, non acute Extr no edema/ tenderness Pelvic per RN and CNM stretch 3 cm/ foley expelled. AROM when possible  FHT  130s cat I Toco irreg  Prenatal labs: ABO, Rh: O/Positive/-- (08/19 0000) Antibody: Negative (08/19 0000) Rubella: Immune (08/19 0000) RPR: Nonreactive (08/19 0000)  HBsAg: Negative (08/19 0000)  HIV: Non-reactive (08/19 0000)  GBS:   Negative (07/27/2020)  Assessment/Plan: 30 yo G2P1001, 39.1 wks. IOL with pitocin for macrosomia and prior h/o shoulder dystocia.A1GDM.  EFW 9-9.1/2 lbs -Prior 10'4" shoulder dystocia hx- prepare room at deliver -C/s if concerns noted in labor   Elveria Royals MD 08/19/20

## 2020-08-19 ENCOUNTER — Other Ambulatory Visit: Payer: Self-pay

## 2020-08-19 ENCOUNTER — Inpatient Hospital Stay (HOSPITAL_COMMUNITY): Payer: 59

## 2020-08-19 ENCOUNTER — Inpatient Hospital Stay (HOSPITAL_COMMUNITY): Payer: 59 | Admitting: Anesthesiology

## 2020-08-19 ENCOUNTER — Encounter (HOSPITAL_COMMUNITY): Payer: Self-pay | Admitting: Obstetrics & Gynecology

## 2020-08-19 ENCOUNTER — Inpatient Hospital Stay (HOSPITAL_COMMUNITY)
Admission: AD | Admit: 2020-08-19 | Discharge: 2020-08-20 | DRG: 807 | Disposition: A | Discharge status: Home / Self Care | Payer: 59 | Attending: Obstetrics & Gynecology | Admitting: Obstetrics & Gynecology

## 2020-08-19 DIAGNOSIS — Z20822 Contact with and (suspected) exposure to covid-19: Secondary | ICD-10-CM | POA: Diagnosis present

## 2020-08-19 DIAGNOSIS — O3663X0 Maternal care for excessive fetal growth, third trimester, not applicable or unspecified: Principal | ICD-10-CM | POA: Diagnosis present

## 2020-08-19 DIAGNOSIS — Z3A39 39 weeks gestation of pregnancy: Secondary | ICD-10-CM | POA: Diagnosis not present

## 2020-08-19 DIAGNOSIS — O24420 Gestational diabetes mellitus in childbirth, diet controlled: Secondary | ICD-10-CM | POA: Diagnosis present

## 2020-08-19 DIAGNOSIS — Z3490 Encounter for supervision of normal pregnancy, unspecified, unspecified trimester: Secondary | ICD-10-CM | POA: Diagnosis present

## 2020-08-19 DIAGNOSIS — O24410 Gestational diabetes mellitus in pregnancy, diet controlled: Secondary | ICD-10-CM | POA: Diagnosis present

## 2020-08-19 DIAGNOSIS — F419 Anxiety disorder, unspecified: Secondary | ICD-10-CM | POA: Diagnosis present

## 2020-08-19 LAB — CBC
HCT: 37.6 % (ref 36.0–46.0)
Hemoglobin: 13.4 g/dL (ref 12.0–15.0)
MCH: 35 pg — ABNORMAL HIGH (ref 26.0–34.0)
MCHC: 35.6 g/dL (ref 30.0–36.0)
MCV: 98.2 fL (ref 80.0–100.0)
Platelets: 164 10*3/uL (ref 150–400)
RBC: 3.83 MIL/uL — ABNORMAL LOW (ref 3.87–5.11)
RDW: 13.8 % (ref 11.5–15.5)
WBC: 8.9 10*3/uL (ref 4.0–10.5)
nRBC: 0 % (ref 0.0–0.2)

## 2020-08-19 LAB — TYPE AND SCREEN
ABO/RH(D): O POS
Antibody Screen: NEGATIVE

## 2020-08-19 LAB — GLUCOSE, CAPILLARY
Glucose-Capillary: 90 mg/dL (ref 70–99)
Glucose-Capillary: 92 mg/dL (ref 70–99)

## 2020-08-19 MED ORDER — ONDANSETRON HCL 4 MG PO TABS: 4.0000 mg | ORAL_TABLET | ORAL | Status: DC | PRN

## 2020-08-19 MED ORDER — MISOPROSTOL 200 MCG PO TABS
ORAL_TABLET | ORAL | Status: AC
  Filled 2020-08-19: qty 5

## 2020-08-19 MED ORDER — IBUPROFEN 600 MG PO TABS
600.0000 mg | ORAL_TABLET | Freq: Four times a day (QID) | ORAL | Status: DC
  Administered 2020-08-20 (×3): 600 mg via ORAL
  Filled 2020-08-19 (×4): qty 1

## 2020-08-19 MED ORDER — OXYTOCIN-SODIUM CHLORIDE 30-0.9 UT/500ML-% IV SOLN
1.0000 m[IU]/min | INTRAVENOUS | Status: DC
  Administered 2020-08-19: 2 m[IU]/min via INTRAVENOUS
  Filled 2020-08-19: qty 500

## 2020-08-19 MED ORDER — DIPHENHYDRAMINE HCL 25 MG PO CAPS
25.0000 mg | ORAL_CAPSULE | Freq: Four times a day (QID) | ORAL | Status: DC | PRN
Start: 2020-08-19 — End: 2020-08-21

## 2020-08-19 MED ORDER — OXYTOCIN-SODIUM CHLORIDE 30-0.9 UT/500ML-% IV SOLN: 2.5000 [IU]/h | INTRAVENOUS | Status: DC

## 2020-08-19 MED ORDER — LIDOCAINE HCL (PF) 1 % IJ SOLN
30.0000 mL | INTRAMUSCULAR | Status: AC | PRN
  Administered 2020-08-19: 30 mL via SUBCUTANEOUS
  Filled 2020-08-19: qty 30

## 2020-08-19 MED ORDER — DIBUCAINE (PERIANAL) 1 % EX OINT: 1.0000 "application " | TOPICAL_OINTMENT | CUTANEOUS | Status: DC | PRN

## 2020-08-19 MED ORDER — FENTANYL-BUPIVACAINE-NACL 0.5-0.125-0.9 MG/250ML-% EP SOLN
EPIDURAL | Status: DC | PRN
  Administered 2020-08-19: 12 mL/h via EPIDURAL

## 2020-08-19 MED ORDER — BENZOCAINE-MENTHOL 20-0.5 % EX AERO
1.0000 | INHALATION_SPRAY | CUTANEOUS | Status: DC | PRN
Start: 2020-08-19 — End: 2020-08-21
  Administered 2020-08-19: 1 via TOPICAL
  Filled 2020-08-19: qty 56

## 2020-08-19 MED ORDER — DIPHENHYDRAMINE HCL 50 MG/ML IJ SOLN: 12.5000 mg | INTRAMUSCULAR | Status: DC | PRN

## 2020-08-19 MED ORDER — LACTATED RINGERS IV SOLN: INTRAVENOUS | Status: DC

## 2020-08-19 MED ORDER — TERBUTALINE SULFATE 1 MG/ML IJ SOLN: 0.2500 mg | Freq: Once | INTRAMUSCULAR | Status: DC | PRN

## 2020-08-19 MED ORDER — COCONUT OIL OIL: 1.0000 "application " | TOPICAL_OIL | Status: DC | PRN

## 2020-08-19 MED ORDER — ACETAMINOPHEN 325 MG PO TABS
650.0000 mg | ORAL_TABLET | ORAL | Status: DC | PRN
  Filled 2020-08-19: qty 2

## 2020-08-19 MED ORDER — LIDOCAINE HCL (PF) 1 % IJ SOLN
INTRAMUSCULAR | Status: DC | PRN
  Administered 2020-08-19: 54 mL via EPIDURAL
  Administered 2020-08-19: 4 mL via EPIDURAL

## 2020-08-19 MED ORDER — ONDANSETRON HCL 4 MG/2ML IJ SOLN: 4.0000 mg | INTRAMUSCULAR | Status: DC | PRN

## 2020-08-19 MED ORDER — SENNOSIDES-DOCUSATE SODIUM 8.6-50 MG PO TABS
2.0000 | ORAL_TABLET | Freq: Every day | ORAL | Status: DC
  Administered 2020-08-20: 2 via ORAL
  Filled 2020-08-19: qty 2

## 2020-08-19 MED ORDER — LACTATED RINGERS IV SOLN
500.0000 mL | Freq: Once | INTRAVENOUS | Status: AC
  Administered 2020-08-19: 500 mL via INTRAVENOUS

## 2020-08-19 MED ORDER — WITCH HAZEL-GLYCERIN EX PADS
1.0000 | MEDICATED_PAD | CUTANEOUS | Status: DC | PRN
Start: 2020-08-19 — End: 2020-08-21

## 2020-08-19 MED ORDER — PHENYLEPHRINE 40 MCG/ML (10ML) SYRINGE FOR IV PUSH (FOR BLOOD PRESSURE SUPPORT): 80.0000 ug | PREFILLED_SYRINGE | INTRAVENOUS | Status: DC | PRN

## 2020-08-19 MED ORDER — EPHEDRINE 5 MG/ML INJ: 10.0000 mg | INTRAVENOUS | Status: DC | PRN

## 2020-08-19 MED ORDER — ZOLPIDEM TARTRATE 5 MG PO TABS: 5.0000 mg | ORAL_TABLET | Freq: Every evening | ORAL | Status: DC | PRN

## 2020-08-19 MED ORDER — TETANUS-DIPHTH-ACELL PERTUSSIS 5-2.5-18.5 LF-MCG/0.5 IM SUSY: 0.5000 mL | PREFILLED_SYRINGE | Freq: Once | INTRAMUSCULAR | Status: DC

## 2020-08-19 MED ORDER — OXYTOCIN BOLUS FROM INFUSION
333.0000 mL | Freq: Once | INTRAVENOUS | Status: AC
  Administered 2020-08-19: 333 mL via INTRAVENOUS

## 2020-08-19 MED ORDER — LACTATED RINGERS IV SOLN: 500.0000 mL | Freq: Once | INTRAVENOUS | Status: DC

## 2020-08-19 MED ORDER — ONDANSETRON HCL 4 MG/2ML IJ SOLN: 4.0000 mg | Freq: Four times a day (QID) | INTRAMUSCULAR | Status: DC | PRN

## 2020-08-19 MED ORDER — MISOPROSTOL 200 MCG PO TABS
1000.0000 ug | ORAL_TABLET | Freq: Once | ORAL | Status: AC
  Administered 2020-08-19: 1000 ug via RECTAL

## 2020-08-19 MED ORDER — SIMETHICONE 80 MG PO CHEW
80.0000 mg | CHEWABLE_TABLET | ORAL | Status: DC | PRN
Start: 2020-08-19 — End: 2020-08-21

## 2020-08-19 MED ORDER — PRENATAL MULTIVITAMIN CH
1.0000 | ORAL_TABLET | Freq: Every day | ORAL | Status: DC
  Filled 2020-08-19: qty 1

## 2020-08-19 MED ORDER — LACTATED RINGERS IV SOLN: 500.0000 mL | INTRAVENOUS | Status: DC | PRN

## 2020-08-19 MED ORDER — FENTANYL-BUPIVACAINE-NACL 0.5-0.125-0.9 MG/250ML-% EP SOLN
12.0000 mL/h | EPIDURAL | Status: DC | PRN
Start: 2020-08-19 — End: 2020-08-19
  Filled 2020-08-19: qty 250

## 2020-08-19 MED ORDER — ACETAMINOPHEN 325 MG PO TABS: 650.0000 mg | ORAL_TABLET | ORAL | Status: DC | PRN

## 2020-08-19 MED ORDER — SOD CITRATE-CITRIC ACID 500-334 MG/5ML PO SOLN: 30.0000 mL | ORAL | Status: DC | PRN

## 2020-08-19 NOTE — Anesthesia Preprocedure Evaluation (Signed)
Anesthesia Evaluation  Patient identified by MRN, date of birth, ID band Patient awake    Reviewed: Allergy & Precautions, Patient's Chart, lab work & pertinent test results  History of Anesthesia Complications Negative for: history of anesthetic complications  Airway Mallampati: II  TM Distance: >3 FB Neck ROM: Full    Dental no notable dental hx.    Pulmonary neg pulmonary ROS,    Pulmonary exam normal        Cardiovascular negative cardio ROS Normal cardiovascular exam     Neuro/Psych  Headaches, Anxiety    GI/Hepatic negative GI ROS, Neg liver ROS,   Endo/Other  diabetes, Gestational  Renal/GU negative Renal ROS  negative genitourinary   Musculoskeletal negative musculoskeletal ROS (+)   Abdominal   Peds  Hematology negative hematology ROS (+)   Anesthesia Other Findings Day of surgery medications reviewed with patient.  Reproductive/Obstetrics (+) Pregnancy                             Anesthesia Physical Anesthesia Plan  ASA: II  Anesthesia Plan: Epidural   Post-op Pain Management:    Induction:   PONV Risk Score and Plan: Treatment may vary due to age or medical condition  Airway Management Planned: Natural Airway  Additional Equipment:   Intra-op Plan:   Post-operative Plan:   Informed Consent: I have reviewed the patients History and Physical, chart, labs and discussed the procedure including the risks, benefits and alternatives for the proposed anesthesia with the patient or authorized representative who has indicated his/her understanding and acceptance.       Plan Discussed with:   Anesthesia Plan Comments:         Anesthesia Quick Evaluation

## 2020-08-19 NOTE — Lactation Note (Signed)
This note was copied from a baby's chart. Lactation Consultation Note  Patient Name: Jennifer Cantrell [id number]'U Date: 08/19/2020 Reason for consult: Difficult latch;Mother's request;Follow-up assessment;Term;Maternal endocrine disorder (Infant with low blood sugar of 47) Age:29 hours LC entered the room, mom was doing STS and infant was cuing the breastfeed. Mom demonstrated hand expression but colostrum not present at this time, mom requesting donor breast milk to supplement infant. After multiple attempts on infant trying latch, mom fitted with 20 mm NS, infant was supplemented with 7 mls of donor breast milk at the breast with curve tip syringe, infant sustained latch and was still breastfeeding after 17 minutes when LC left the room. Mom did not notice breast changes, past hx of low milk supply with 1st child who never latch even with a NS, mom has flat nipples. Mom was happy infant latched at the breast with this feeding and that he is sustaining latch with 20 mm NS. Mom will continue to BF infant according to cues, 8 to 12+ or more times within 24 hours, STS. LC discussed infant's input and output. Mom shown how to use DEBP & how to disassemble, clean, & reassemble parts. Mom made aware of O/P services, breastfeeding support groups, community resources, and our phone # for post-discharge questions.  Mom's plan: 1- Mom will pre-pump breast with hand pump, then apply 20 mm NS and supplement infant at the breast with donor breast milk. 2- Afterwards mom will use DEBP every 3 hours for 15 minutes on initial setting, after latching infant at the breast mom  and will give infant back any EBM before donor breast milk. 3-Mom knows to call RN or LC for latch assistance if needed.   Maternal Data Has patient been taught Hand Expression?: Yes Does the patient have breastfeeding experience prior to this delivery?: Yes How long did the patient breastfeed?: Per mom, she attempted to breastfeed her 11 year  old son, but had low milk supply, she used NS with her son but he never latched.  Feeding Mother's Current Feeding Choice: Breast Milk and Donor Milk  LATCH Score Latch: Grasps breast easily, tongue down, lips flanged, rhythmical sucking.  Audible Swallowing: Spontaneous and intermittent  Type of Nipple: Flat (Mom is using 20 mm NS)  Comfort (Breast/Nipple): Filling, red/small blisters or bruises, mild/mod discomfort (On right breast only)  Hold (Positioning): Assistance needed to correctly position infant at breast and maintain latch.  LATCH Score: 7   Lactation Tools Discussed/Used Tools: Shells;Pump Breast pump type: Double-Electric Breast Pump Pump Education: Setup, frequency, and cleaning;Milk Storage Reason for Pumping: Infant was not latching, mom with hx of low milk supply, mom using 20 mm NS Pumping frequency: Mom will pump every 3 hours for 15 minutes on inital setting.  Interventions Interventions: Breast feeding basics reviewed;Assisted with latch;Skin to skin;Pre-pump if needed;Breast compression;Adjust position;Support pillows;Position options;Expressed milk;Shells;Hand pump;DEBP;Education  Discharge Pump: DEBP;Personal;Manual WIC Program: No  Consult Status Consult Status: Follow-up Date: 08/20/20 Follow-up type: In-patient    Vicente Serene 08/19/2020, 9:48 PM

## 2020-08-19 NOTE — Anesthesia Procedure Notes (Signed)
Epidural Patient location during procedure: OB Start time: 08/19/2020 3:36 PM End time: 08/19/2020 3:39 PM  Staffing Anesthesiologist: Brennan Bailey, MD Performed: anesthesiologist   Preanesthetic Checklist Completed: patient identified, IV checked, risks and benefits discussed, monitors and equipment checked, pre-op evaluation and timeout performed  Epidural Patient position: sitting Prep: DuraPrep and site prepped and draped Patient monitoring: continuous pulse ox, blood pressure and heart rate Approach: midline Location: L3-L4 Injection technique: LOR air  Needle:  Needle type: Tuohy  Needle gauge: 17 G Needle length: 9 cm Needle insertion depth: 6 cm Catheter type: closed end flexible Catheter size: 19 Gauge Catheter at skin depth: 11 cm Test dose: negative and Other (1% lidocaine)  Assessment Events: blood not aspirated, injection not painful, no injection resistance, no paresthesia and negative IV test  Additional Notes Patient identified. Risks, benefits, and alternatives discussed with patient including but not limited to bleeding, infection, nerve damage, paralysis, failed block, incomplete pain control, headache, blood pressure changes, nausea, vomiting, reactions to medication, itching, and postpartum back pain. Confirmed with bedside nurse the patient's most recent platelet count. Confirmed with patient that they are not currently taking any anticoagulation, have any bleeding history, or any family history of bleeding disorders. Patient expressed understanding and wished to proceed. All questions were answered. Sterile technique was used throughout the entire procedure. Please see nursing notes for vital signs.   Crisp LOR on first pass. Test dose was given through epidural catheter and negative prior to continuing to dose epidural or start infusion. Warning signs of high block given to the patient including shortness of breath, tingling/numbness in hands, complete  motor block, or any concerning symptoms with instructions to call for help. Patient was given instructions on fall risk and not to get out of bed. All questions and concerns addressed with instructions to call with any issues or inadequate analgesia.  Reason for block:procedure for pain

## 2020-08-19 NOTE — Lactation Note (Signed)
This note was copied from a baby's chart. Lactation Consultation Note  Patient Name: Jennifer Cantrell AVWUJ'W Date: 08/19/2020 Reason for consult: L&D Initial assessment;Mother's request;Difficult latch;Term;Maternal endocrine disorder Age: < 1hr LC examined Mom's breasts. Nipples are flat and small with areola edema. LC did some breast massage, nipple role and reverse pressure. LC latched infant in a tea cup hold, infant showed signs of transfer on and off the latch total 6 minutes.  Mom not able to sustain tea cup hold to maintain the latch.  LC to provide breast shells and get Mom pre pumping once on the floor.   Maternal Data Has patient been taught Hand Expression?: Yes Does the patient have breastfeeding experience prior to this delivery?: Yes How long did the patient breastfeed?: Not able to get infant to latch, mostly bottle fed  Feeding Mother's Current Feeding Choice: Breast Milk  LATCH Score Latch: Repeated attempts needed to sustain latch, nipple held in mouth throughout feeding, stimulation needed to elicit sucking reflex.  Audible Swallowing: A few with stimulation  Type of Nipple: Flat  Comfort (Breast/Nipple): Soft / non-tender  Hold (Positioning): Assistance needed to correctly position infant at breast and maintain latch.  LATCH Score: 6   Lactation Tools Discussed/Used    Interventions Interventions: Breast feeding basics reviewed;Breast compression;Assisted with latch;Adjust position;Skin to skin;Support pillows;Breast massage;Position options;Hand express;Education  Discharge Pump: Personal WIC Program: No  Consult Status Consult Status: Follow-up Date: 08/20/20 Follow-up type: In-patient    Jennifer Huegel  Cantrell 08/19/2020, 6:12 PM

## 2020-08-19 NOTE — Lactation Note (Signed)
This note was copied from a baby's chart. Lactation Consultation Note  Patient Name: Jennifer Cantrell OEHOZ'Y Date: 08/19/2020   Age:29 hours LC contacted RN, Martinique Williams to see if this is a good time to assist Mom with latching. Infant receiving respiratory support at this time. RN to call when they are ready to try latching.  Maternal Data    Feeding    LATCH Score                    Lactation Tools Discussed/Used    Interventions    Discharge    Consult Status      Demitrius Crass  Nicholson-Springer 08/19/2020, 5:28 PM

## 2020-08-20 DIAGNOSIS — F419 Anxiety disorder, unspecified: Secondary | ICD-10-CM | POA: Diagnosis present

## 2020-08-20 DIAGNOSIS — O24410 Gestational diabetes mellitus in pregnancy, diet controlled: Secondary | ICD-10-CM | POA: Diagnosis present

## 2020-08-20 LAB — CBC
HCT: 31.4 % — ABNORMAL LOW (ref 36.0–46.0)
Hemoglobin: 11.1 g/dL — ABNORMAL LOW (ref 12.0–15.0)
MCH: 35 pg — ABNORMAL HIGH (ref 26.0–34.0)
MCHC: 35.4 g/dL (ref 30.0–36.0)
MCV: 99.1 fL (ref 80.0–100.0)
Platelets: 155 10*3/uL (ref 150–400)
RBC: 3.17 MIL/uL — ABNORMAL LOW (ref 3.87–5.11)
RDW: 14 % (ref 11.5–15.5)
WBC: 14.9 10*3/uL — ABNORMAL HIGH (ref 4.0–10.5)
nRBC: 0 % (ref 0.0–0.2)

## 2020-08-20 LAB — RPR: RPR Ser Ql: NONREACTIVE

## 2020-08-20 MED ORDER — IBUPROFEN 600 MG PO TABS: 600.0000 mg | ORAL_TABLET | Freq: Four times a day (QID) | ORAL | 0 refills | Status: AC

## 2020-08-20 MED ORDER — ACETAMINOPHEN 325 MG PO TABS: 650.0000 mg | ORAL_TABLET | ORAL | 1 refills | Status: AC | PRN

## 2020-08-20 NOTE — Anesthesia Postprocedure Evaluation (Signed)
Anesthesia Post Note  Patient: Jennifer Cantrell  Procedure(s) Performed: AN AD HOC LABOR EPIDURAL     Patient location during evaluation: Mother Baby Anesthesia Type: Epidural Level of consciousness: awake and alert Pain management: pain level controlled Vital Signs Assessment: post-procedure vital signs reviewed and stable Respiratory status: spontaneous breathing, nonlabored ventilation and respiratory function stable Cardiovascular status: stable Postop Assessment: no headache, no backache and epidural receding Anesthetic complications: no   No complications documented.  Last Vitals:  Vitals:   08/20/20 0020 08/20/20 0430  BP: 121/74 103/68  Pulse: 81 78  Resp: 18 16  Temp: 36.8 C 36.7 C  SpO2: 99% 100%    Last Pain:  Vitals:   08/20/20 0430  TempSrc: Oral  PainSc: 0-No pain   Pain Goal: Patients Stated Pain Goal: 3 (08/20/20 0020)                 Barkley Boards

## 2020-08-20 NOTE — Social Work (Signed)
CSW received consult for hx of Anxiety.  CSW met with MOB to offer support and complete assessment.     CSW introduced self and role. CSW observed MOB holding infant on couch and FOB Ameka Krigbaum also present. MOB provided permission to speak with FOB present. CSW informed MOB of reason for consult. MOB was pleasant and understanding. MOB reported she was diagnosed with anxiety years ago and was on Celexa in high school. MOB stated she was on Zoloft 44m prior to pregnancy and plans to restart postpartum. MOB shared she has been to therapy in the past but had a hard time finding the right fit. MOB reported she is currently feeling well and denies any SI or HI. Aside from FOB, MOB identified her mom and friends as supports.   CSW provided education regarding the baby blues period versus PPD and provided resources. CSW provided the New Mom Checklist and encouraged MOB to self evaluate and contact a medical professional if symptoms are noted at any time.  CSW provided review of Sudden Infant Death Syndrome (SIDS) precautions. MOB reported she has all essentials for infant, including a bassinet. MOB denies any barriers to follow-up care. MOB identifies no additional needs at this time.    CSW identifies no further need for intervention and no barriers to discharge at this time.  CDarra Lis LNew DealWork WEnterprise Productsand CMolson [phone]

## 2020-08-20 NOTE — Progress Notes (Signed)
AVS printed and discharged instructions given to patient. Patient instructed to call for follow-up appointment and to pick up prescriptions. All questions answered and patient verbalized understanding.

## 2020-08-20 NOTE — Lactation Note (Signed)
This note was copied from a baby's chart. Lactation Consultation Note  Patient Name: Boy Marithza Malachi KZSWF'U Date: 08/20/2020 Reason for consult: Follow-up assessment;Maternal endocrine disorder;Term;Infant weight loss Age:29 hours  Visited with mom of 72 hours old FT female, she's a P2 and didn't report breast changes during this pregnancy. Mom has been using the NS #20 consistently on every feeding to latch baby to breast but she hasn't been pumping, she has flat nipples and hx of low milk supply.  Explained to mom the importance of consistent pumping but she voiced she prefers to put baby to breast instead. Her right nipple has a small scab on the bottom half, LC assisted with hand expression and a small droplet was obtained, praised mom for her efforts, LC rubbed it on mom's nipple.  Offered coconut oil in the mean time until her milk comes in to volume to treat her soreness, but she politely declined, she told LC she has her own organic nipple butter. Baby already nursing when entered the room, but he had started to slow down and fall asleep but still sucking at the breast with a few audible swallows noted, mom is putting some donor milk into NS #20.  Offered to set up a SNS since mom's goal is to supplement baby at the breast in the meantime, reviewed supplementation guidelines, cluster feeding, STS care, lactogenesis II and size of baby's stomach. LC also showed mom the correct way on how to align the NS with baby's nose. Mom aware that supplementation amounts will increase and that an SNS will probably make it easier to handle larger volumes but she'll think about it and let us know.  Feeding plan:  1. Encouraged to continue feeding baby STS on cues 8-12 times/24 hours or sooner if feeding cues are present. Will use NS #20 + syringe feedings as needed 2. Pumping prior feedings to evert her nipples was also encouraged, as well as post-pumping for breast stimulation 3. Parents will continue  using EBM/donor milk to supplement baby and will increase amounts according to supplementation guidelines  FOB present and supportive. Parents reported all questions and concerns were answered, they're both aware of [facility] OP services and will call PRN.   Maternal Data    Feeding Mother's Current Feeding Choice: Breast Milk and Donor Milk  LATCH Score Latch: Repeated attempts needed to sustain latch, nipple held in mouth throughout feeding, stimulation needed to elicit sucking reflex. (baby already falling asleep when LC entered the room)  Audible Swallowing: A few with stimulation  Type of Nipple: Flat  Comfort (Breast/Nipple): Soft / non-tender  Hold (Positioning): No assistance needed to correctly position infant at breast. (minimal assistance needed)  LATCH Score: 7   Lactation Tools Discussed/Used Tools: Pump;Nipple [employer];Shells Nipple shield size: 20 Breast pump type: Double-Electric Breast Pump;Manual  Interventions Interventions: Breast feeding basics reviewed;Skin to skin;Breast massage;Hand express;Breast compression;DEBP;Hand pump;Support pillows;Shells  Discharge Pump: DEBP;Manual;Personal  Consult Status Consult Status: Follow-up Date: 08/21/20 Follow-up type: In-patient    Gjon Letarte Francene Boyers 08/20/2020, 4:33 PM

## 2020-08-20 NOTE — Discharge Summary (Signed)
OB Discharge Summary  Patient Name: Jennifer Cantrell DOB: [birth year] MRN: [id number]  Date of admission: 08/19/2020 Delivering provider: MODY, VAISHALI   Admitting diagnosis: Encounter for induction of labor [Z34.90] SVD (spontaneous vaginal delivery) [O80] Intrauterine pregnancy: [redacted]w[redacted]d     Secondary diagnosis: Patient Active Problem List   Diagnosis Date Noted  . GDM, class A1 08/20/2020  . Anxiety 08/20/2020  . Encounter for induction of labor 08/19/2020  . SVD (spontaneous vaginal delivery) 08/19/2020  . Postpartum care following vaginal delivery (3/10) 08/19/2020  . Second degree perineal laceration 08/19/2020    Date of discharge: 08/20/2020   Discharge diagnosis: Principal Problem:   Postpartum care following vaginal delivery (3/10) Active Problems:   Encounter for induction of labor   SVD (spontaneous vaginal delivery)   Second degree perineal laceration   GDM, class A1   Anxiety                                                        Post partum procedures:None  Augmentation: AROM, Pitocin and IP Foley Pain control: Epidural  Laceration:2nd degree;Perineal  Episiotomy:None  Complications: None  Hospital course:  Induction of Labor With Vaginal Delivery   30 y.o. yo G2P2002 at [redacted]w[redacted]d was admitted to the hospital 08/19/2020 for induction of labor.  Indication for induction: A1 DM.  Patient had an uncomplicated labor course as follows: Membrane Rupture Time/Date: 1:04 PM ,08/19/2020   Delivery Method:Vaginal, Spontaneous  Episiotomy: None  Lacerations:  2nd degree;Perineal  Details of delivery can be found in separate delivery note.  Patient had a routine postpartum course. Patient is discharged home 08/20/20.  Newborn Data: Birth date:08/19/2020  Birth time:5:04 PM  Gender:Female  Living status:Living  Apgars:8 ,9  Weight:3900 g   Physical exam  Vitals:   08/20/20 0020 08/20/20 0430 08/20/20 0900 08/20/20 1305  BP: 121/74 103/68 111/69 124/74  Pulse: 81 78 71 68   Resp: 18 16 17 18   Temp: 98.3 F (36.8 C) 98.1 F (36.7 C) 97.6 F (36.4 C) 97.7 F (36.5 C)  TempSrc: Oral Oral Oral Oral  SpO2: 99% 100% 100% 98%  Weight:      Height:       General: alert, cooperative and no distress Lochia: appropriate Uterine Fundus: firm Perineum: repair intact, no edema DVT Evaluation: No evidence of DVT seen on physical exam. No significant calf/ankle edema. Labs: Lab Results  Component Value Date   WBC 14.9 (H) 08/20/2020   HGB 11.1 (L) 08/20/2020   HCT 31.4 (L) 08/20/2020   MCV 99.1 08/20/2020   PLT 155 08/20/2020   CMP Latest Ref Rng & Units 06/05/2020  Glucose 70 - 99 mg/dL 132(H)  BUN 6 - 20 mg/dL 6  Creatinine 0.44 - 1.00 mg/dL 0.68  Sodium 135 - 145 mmol/L 135  Potassium 3.5 - 5.1 mmol/L 4.2  Chloride 98 - 111 mmol/L 104  CO2 22 - 32 mmol/L 21(L)  Calcium 8.9 - 10.3 mg/dL 9.3  Total Protein 6.5 - 8.1 g/dL 6.3(L)  Total Bilirubin 0.3 - 1.2 mg/dL 0.5  Alkaline Phos 38 - 126 U/L 63  AST 15 - 41 U/L 22  ALT 0 - 44 U/L 19   Edinburgh Postnatal Depression Scale Screening Tool 08/19/2020  I have been able to laugh and see the funny side of things. (No Data)  Vaccines: TDaP UTD         Flu    UTD         COVID-19   UTD  Discharge instructions:  per After Visit Summary  After Visit Meds:  Allergies as of 08/20/2020      Reactions   Bupropion Other (See Comments)   Excessive crying/ mood swings   Levofloxacin Nausea And Vomiting      Medication List    TAKE these medications   acetaminophen 325 MG tablet Commonly known as: Tylenol Take 2 tablets (650 mg total) by mouth every 4 (four) hours as needed (for pain scale < 4).   ibuprofen 600 MG tablet Commonly known as: ADVIL Take 1 tablet (600 mg total) by mouth every 6 (six) hours.   OMEGA III EPA+DHA PO Take 1 capsule by mouth daily.   omeprazole 20 MG capsule Commonly known as: PRILOSEC Take 20 mg by mouth daily as needed for heartburn.   PRENATAL VITAMINS PO Take  by mouth daily.      Diet: routine diet  Activity: Advance as tolerated. Pelvic rest for 6 weeks.   Newborn Data: Live born female  Birth Weight: 8 lb 9.6 oz (3900 g) APGAR: 8, 9  Newborn Delivery   Birth date/time: 08/19/2020 17:04:00 Delivery type: Vaginal, Spontaneous     Named Fisher Baby Feeding: Bottle and Breast Disposition:home with mother  Delivery Report:  Review the Delivery Report for details.    Follow up:  Follow-up Information    Azucena Fallen, MD. Schedule an appointment as soon as possible for a visit in 6 week(s).   Specialty: Obstetrics and Gynecology Why: Please make an appointment for 6 weeks postpartum.  Contact information: [place] [address] [place], CNM, MSN 08/20/2020, 2:26 PM
